# Patient Record
Sex: Male | Born: 1975 | Race: White | Hispanic: No | Marital: Single
Health system: Southern US, Community
[De-identification: ages and names within clinical notes are randomized; demographics above are authoritative.]

## PROBLEM LIST (undated history)

## (undated) DIAGNOSIS — S82892A Other fracture of left lower leg, initial encounter for closed fracture: Secondary | ICD-10-CM

---

## 2018-07-02 ENCOUNTER — Emergency Department: Admit: 2018-07-03 | Payer: PRIVATE HEALTH INSURANCE

## 2018-07-02 DIAGNOSIS — S82842A Displaced bimalleolar fracture of left lower leg, initial encounter for closed fracture: Secondary | ICD-10-CM

## 2018-07-02 NOTE — ED Notes (Signed)
Patient remains awake, alert, oriented x4, talking at 2101.      Second dose propofol administered by provider at 2102.      121/60; 100% on 2L NC; HR89 at 2102.    Manipulation of ankle at 2103.      Splinting in place at 2105.      130/80; HR 86; 100% on 2L    Patient tolerated procedure well, remains oriented x4, responsive to verbal stimuli.

## 2018-07-02 NOTE — ED Notes (Signed)
Patient has been provided crutches with instructions.  Splint in place.  Patient tolerated procedure well.

## 2018-07-02 NOTE — ED Notes (Signed)
End tidal CO2 monitoring, cardiac monitoring, continuous pulse oximetry in place.  Ambu bag at bedside, suction at bedside. Normal saline KVO fluids infusing.

## 2018-07-02 NOTE — ED Notes (Signed)
Consent for moderate sedation and reduction of left ankle signed by wife, Amy. Dr. Bourdon and Janoy, RN present.

## 2018-07-02 NOTE — ED Notes (Signed)
Moderate sedation beginning at 2059.     Dr. Bourdon administering propofol IV.  135/75; HR 91; 100% on 2L NC at 2100
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
## 2018-07-02 NOTE — ED Notes (Signed)
I have reviewed discharge instructions with the patient.  The patient verbalized understanding.    Patient left ED via Discharge Method: wheelchair to Home with wife.    Opportunity for questions and clarification provided.       Patient given 1 scripts.         To continue your aftercare when you leave the hospital, you may receive an automated call from our care team to check in on how you are doing.  This is a free service and part of our promise to provide the best care and service to meet your aftercare needs.??? If you have questions, or wish to unsubscribe from this service please call [phone].  Thank you for Choosing our [facility].

## 2018-07-02 NOTE — ED Notes (Addendum)
Radiology called for repeat xray. Sensation present in left foot; patient reports throbbing.  Pedal pulse present.
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
## 2018-07-02 NOTE — ED Notes (Signed)
Consent for moderate sedation and reduction of left ankle signed by wife, Amy. Dr. Marcene Corning and Isabelle Course, RN present.

## 2018-07-02 NOTE — ED Notes (Signed)
Moderate sedation beginning at 2059.     Dr. Marcene CorningBourdon administering propofol IV.  135/75; HR 91; 100% on 2L NC at 2100

## 2018-07-02 NOTE — ED Notes (Signed)
Radiology called for repeat xray. Sensation present in left foot; patient reports throbbing.  Pedal pulse present.

## 2018-07-02 NOTE — ED Notes (Signed)
Patient remains awake, alert, oriented x4, talking at 2101.      Second dose propofol administered by provider at 2102.      121/60; 100% on 2L NC; HR89 at 2102.    Manipulation of ankle at 2103.      Splinting in place at 2105.      130/80; HR 86; 100% on 2L    Patient tolerated procedure well, remains oriented x4, responsive to verbal stimuli.

## 2018-07-02 NOTE — ED Notes (Signed)
End tidal CO2 monitoring, cardiac monitoring, continuous pulse oximetry in place.  Ambu bag at bedside, suction at bedside. Normal saline KVO fluids infusing.
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
## 2018-07-02 NOTE — ED Notes (Signed)
 I have reviewed discharge instructions with the patient.  The patient verbalized understanding.    Patient left ED via Discharge Method: wheelchair to Home with wife.    Opportunity for questions and clarification provided.       Patient given 1 scripts.         To continue your aftercare when you leave the hospital, you may receive an automated call from our care team to check in on how you are doing.  This is a free service and part of our promise to provide the best care and service to meet your aftercare needs." If you have questions, or wish to unsubscribe from this service please call [phone].  Thank you for Choosing our [facility] Emergency Department.

## 2018-07-02 NOTE — ED Notes (Signed)
Patient has been provided crutches with instructions.  Splint in place.  Patient tolerated procedure well.

## 2018-07-03 ENCOUNTER — Inpatient Hospital Stay
Admit: 2018-07-03 | Discharge: 2018-07-03 | Disposition: A | Discharge status: Home / Self Care | Payer: PRIVATE HEALTH INSURANCE | Attending: Emergency Medicine

## 2018-07-03 MED ORDER — HYDROCODONE-ACETAMINOPHEN 10 MG-325 MG TAB
10-325 mg | ORAL_TABLET | Freq: Four times a day (QID) | ORAL | 0 refills | Status: AC | PRN
Start: 2018-07-03 — End: 2018-07-05

## 2018-07-03 MED ORDER — PROPOFOL 10 MG/ML IV EMUL
10 mg/mL | INTRAVENOUS | Status: AC
Start: 2018-07-03 — End: 2018-07-02
  Administered 2018-07-03: 01:00:00 via INTRAVENOUS

## 2018-07-03 MED ORDER — HYDROMORPHONE (PF) 1 MG/ML IJ SOLN
1 mg/mL | Freq: Once | INTRAMUSCULAR | Status: AC
Start: 2018-07-03 — End: 2018-07-02
  Administered 2018-07-03: 01:00:00 via INTRAVENOUS

## 2018-07-03 MED ORDER — HYDROMORPHONE (PF) 1 MG/ML IJ SOLN
1 mg/mL | INTRAMUSCULAR | Status: AC
Start: 2018-07-03 — End: 2018-07-02
  Administered 2018-07-03: 01:00:00 via INTRAVENOUS

## 2018-07-03 MED ORDER — ONDANSETRON (PF) 4 MG/2 ML INJECTION
4 mg/2 mL | INTRAMUSCULAR | Status: AC
Start: 2018-07-03 — End: 2018-07-02
  Administered 2018-07-03: via INTRAVENOUS

## 2018-07-03 MED ORDER — MORPHINE 10 MG/ML INJ SOLUTION
10 mg/ml | INTRAMUSCULAR | Status: AC
Start: 2018-07-03 — End: 2018-07-02
  Administered 2018-07-03: via INTRAVENOUS

## 2018-07-03 MED FILL — HYDROMORPHONE (PF) 1 MG/ML IJ SOLN: 1 mg/mL | INTRAMUSCULAR | Qty: 1

## 2018-07-03 MED FILL — PROPOFOL 10 MG/ML IV EMUL: 10 mg/mL | INTRAVENOUS | Qty: 20

## 2018-07-03 MED FILL — MORPHINE 10 MG/ML SYRINGE: 10 mg/mL | INTRAMUSCULAR | Qty: 1

## 2018-07-03 MED FILL — ONDANSETRON (PF) 4 MG/2 ML INJECTION: 4 mg/2 mL | INTRAMUSCULAR | Qty: 2

## 2018-07-04 ENCOUNTER — Inpatient Hospital Stay: Payer: PRIVATE HEALTH INSURANCE

## 2018-07-04 ENCOUNTER — Ambulatory Visit: Admit: 2018-07-04 | Payer: PRIVATE HEALTH INSURANCE

## 2018-07-04 MED ORDER — SODIUM CHLORIDE 0.9 % IJ SYRG
INTRAMUSCULAR | Status: DC | PRN
Start: 2018-07-04 — End: 2018-07-04

## 2018-07-04 MED ORDER — SODIUM CHLORIDE 0.9 % IJ SYRG
Freq: Three times a day (TID) | INTRAMUSCULAR | Status: DC
Start: 2018-07-04 — End: 2018-07-04

## 2018-07-04 MED ORDER — LIDOCAINE HCL 1 % (10 MG/ML) IJ SOLN
10 mg/mL (1 %) | INTRAMUSCULAR | Status: DC | PRN
Start: 2018-07-04 — End: 2018-07-04

## 2018-07-04 MED ORDER — ACETAMINOPHEN 500 MG TAB
500 mg | Freq: Four times a day (QID) | ORAL | Status: DC | PRN
Start: 2018-07-04 — End: 2018-07-04

## 2018-07-04 MED ORDER — CEFAZOLIN 2 GRAM/20 ML IN STERILE WATER INTRAVENOUS SYRINGE
2 gram/0 mL | Freq: Once | INTRAVENOUS | Status: AC
Start: 2018-07-04 — End: 2018-07-04
  Administered 2018-07-04: 20:00:00 via INTRAVENOUS

## 2018-07-04 MED ORDER — HYDROCODONE-ACETAMINOPHEN 5 MG-325 MG TAB
5-325 mg | ORAL | Status: DC | PRN
Start: 2018-07-04 — End: 2018-07-04

## 2018-07-04 MED ORDER — MIDAZOLAM 1 MG/ML IJ SOLN
1 mg/mL | Freq: Once | INTRAMUSCULAR | Status: AC | PRN
Start: 2018-07-04 — End: 2018-07-04
  Administered 2018-07-04: 17:00:00 via INTRAVENOUS

## 2018-07-04 MED ORDER — ROPIVACAINE (PF) 5 MG/ML (0.5 %) INJECTION
5 mg/mL (0. %) | INTRAMUSCULAR | Status: AC
Start: 2018-07-04 — End: 2018-07-04
  Administered 2018-07-04: 17:00:00 via PERINEURAL

## 2018-07-04 MED ORDER — SODIUM CHLORIDE 0.9 % INJECTION
25 mg/mL | INTRAMUSCULAR | Status: DC | PRN
Start: 2018-07-04 — End: 2018-07-04

## 2018-07-04 MED ORDER — PROPOFOL 10 MG/ML IV EMUL
10 mg/mL | INTRAVENOUS | Status: DC | PRN
Start: 2018-07-04 — End: 2018-07-04
  Administered 2018-07-04: 20:00:00 via INTRAVENOUS

## 2018-07-04 MED ORDER — LACTATED RINGERS IV
INTRAVENOUS | Status: DC
Start: 2018-07-04 — End: 2018-07-04
  Administered 2018-07-04: 17:00:00 via INTRAVENOUS

## 2018-07-04 MED ORDER — FENTANYL CITRATE (PF) 50 MCG/ML IJ SOLN
50 mcg/mL | Freq: Once | INTRAMUSCULAR | Status: AC
Start: 2018-07-04 — End: 2018-07-04
  Administered 2018-07-04: 17:00:00 via INTRAVENOUS

## 2018-07-04 MED ORDER — MIDAZOLAM 1 MG/ML IJ SOLN
1 mg/mL | INTRAMUSCULAR | Status: AC
Start: 2018-07-04 — End: ?

## 2018-07-04 MED ORDER — SODIUM CHLORIDE 0.9 % IV
INTRAVENOUS | Status: DC
Start: 2018-07-04 — End: 2018-07-04

## 2018-07-04 MED ORDER — FAMOTIDINE 20 MG TAB
20 mg | Freq: Once | ORAL | Status: AC
Start: 2018-07-04 — End: 2018-07-04
  Administered 2018-07-04: 17:00:00 via ORAL

## 2018-07-04 MED ORDER — ROPIVACAINE (PF) 5 MG/ML (0.5 %) INJECTION
50.5 mg/mL (0. %) | INTRAMUSCULAR | Status: AC
Start: 2018-07-04 — End: 2018-07-04
  Administered 2018-07-04: 17:00:00 via PERINEURAL

## 2018-07-04 MED ORDER — HYDROMORPHONE (PF) 2 MG/ML IJ SOLN
2 mg/mL | INTRAMUSCULAR | Status: DC | PRN
Start: 2018-07-04 — End: 2018-07-04

## 2018-07-04 MED ORDER — LACTATED RINGERS IV
INTRAVENOUS | Status: DC
Start: 2018-07-04 — End: 2018-07-04

## 2018-07-04 MED ORDER — LIDOCAINE (PF) 20 MG/ML (2 %) IJ SOLN
20 mg/mL (2 %) | INTRAMUSCULAR | Status: DC | PRN
Start: 2018-07-04 — End: 2018-07-04
  Administered 2018-07-04: 20:00:00 via INTRAVENOUS

## 2018-07-04 MED FILL — FENTANYL CITRATE (PF) 50 MCG/ML IJ SOLN: 50 mcg/mL | INTRAMUSCULAR | Qty: 2

## 2018-07-04 MED FILL — NAROPIN (PF) 5 MG/ML (0.5 %) INJECTION SOLUTION: 5 mg/mL (0. %) | INTRAMUSCULAR | Qty: 22.5

## 2018-07-04 MED FILL — MIDAZOLAM 1 MG/ML IJ SOLN: 1 mg/mL | INTRAMUSCULAR | Qty: 2

## 2018-07-04 MED FILL — XYLOCAINE-MPF 20 MG/ML (2 %) INJECTION SOLUTION: 20 mg/mL (2 %) | INTRAMUSCULAR | Qty: 60

## 2018-07-04 MED FILL — EPINEPHRINE (PF) 1 MG/ML INJECTION: 1 mg/mL ( mL) | INTRAMUSCULAR | Qty: 0.1

## 2018-07-04 MED FILL — CEFAZOLIN 2 GRAM/20 ML IN STERILE WATER INTRAVENOUS SYRINGE: 2 gram/0 mL | INTRAVENOUS | Qty: 20

## 2018-07-04 MED FILL — FAMOTIDINE 20 MG TAB: 20 mg | ORAL | Qty: 1

## 2018-07-04 MED FILL — PROPOFOL 10 MG/ML IV EMUL: 10 mg/mL | INTRAVENOUS | Qty: 412.06
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
## 2018-07-04 NOTE — Anesthesia Post-Procedure Evaluation (Signed)
Procedure(s):  ANKLE OPEN REDUCTION INTERNAL FIXATION.    total IV anesthesia, general - backup    Anesthesia Post Evaluation      Multimodal analgesia: multimodal analgesia used between 6 hours prior to anesthesia start to PACU discharge  Patient location during evaluation: bedside  Patient participation: complete - patient participated  Level of consciousness: awake and alert  Pain score: 0  Pain management: adequate  Airway patency: patent  Anesthetic complications: no  Cardiovascular status: acceptable  Respiratory status: acceptable  Hydration status: acceptable  Comments: Pt doing well. Ok to d/c home.   Post anesthesia nausea and vomiting:  none      Vitals Value Taken Time   BP 105/76 07/04/2018  4:41 PM   Temp 36.7 ??C (98 ??F) 07/04/2018  4:25 PM   Pulse 68 07/04/2018  4:43 PM   Resp 14 07/04/2018  4:41 PM   SpO2 99 % 07/04/2018  4:43 PM   Vitals shown include unvalidated device data.

## 2018-07-04 NOTE — Anesthesia Procedure Notes (Signed)
Peripheral Block    Start time: 07/04/2018 1:17 PM  End time: 07/04/2018 1:21 PM  Performed by: Yesenia Locurto E, MD  Authorized by: Samuel Rittenhouse E, MD       Pre-procedure:   Indications: at surgeon's request and post-op pain management    Preanesthetic Checklist: patient identified, risks and benefits discussed, site marked, timeout performed, anesthesia consent given and patient being monitored    Timeout Time: 13:17          Block Type:   Block Type:  Adductor canal  Laterality:  Left  Monitoring:  Standard ASA monitoring, responsive to questions, oxygen, continuous pulse ox, frequent vital sign checks and heart rate  Injection Technique:  Single shot  Procedures: ultrasound guided    Patient Position: supine  Prep: chlorhexidine    Location:  Mid thigh  Needle Type:  Stimuplex  Needle Gauge:  22 G  Needle Localization:  Ultrasound guidance    Assessment:  Number of attempts:  1  Injection Assessment:  Incremental injection every 5 mL, local visualized surrounding nerve on ultrasound, negative aspiration for blood, no intravascular symptoms, no paresthesia and ultrasound image on chart  Patient tolerance:  Patient tolerated the procedure well with no immediate complications  All needles out intact, proc. Tolerated well.

## 2018-07-04 NOTE — Other (Signed)
PACU DISCHARGE NOTE  Vital signs stable, pain well controlled, alert and oriented times three or at baseline, no anesthetic complications. IV removed with catheter tip intact. Patient has crutches and has experience with their use. Written and verbal discharge instructions given, including pain control, dressing care and follow up appointment.   Spouse, Amy, verbalized understanding and signed discharge instructions electronically. All questions answered prior to discharge. Dr Simril okay to discharge at this time.  Pt and all belongings taken via wheelchair and safely put in vehicle.

## 2018-07-04 NOTE — Anesthesia Procedure Notes (Signed)
Peripheral Block    Start time: 07/04/2018 1:11 PM  End time: 07/04/2018 1:16 PM  Performed by: Robina Hamor E, MD  Authorized by: Hollyann Pablo E, MD       Pre-procedure:   Indications: at surgeon's request and post-op pain management    Preanesthetic Checklist: patient identified, risks and benefits discussed, site marked, timeout performed, anesthesia consent given and patient being monitored    Timeout Time: 13:11          Block Type:   Block Type:  Popliteal  Laterality:  Left  Monitoring:  Standard ASA monitoring, responsive to questions, oxygen, continuous pulse ox, frequent vital sign checks and heart rate  Injection Technique:  Single shot  Procedures: ultrasound guided and nerve stimulator    Patient Position: right lateral decubitus (lateral decubitus)  Prep: chlorhexidine    Location:  Mid thigh  Needle Type:  Stimuplex  Needle Gauge:  22 G  Needle Localization:  Nerve stimulator and ultrasound guidance  Motor Response: minimal motor response >0.4 mA      Assessment:  Number of attempts:  1  Injection Assessment:  Incremental injection every 5 mL, local visualized surrounding nerve on ultrasound, negative aspiration for blood, no intravascular symptoms, no paresthesia and ultrasound image on chart  Patient tolerance:  Patient tolerated the procedure well with no immediate complications  All needles out intact, proc. Tolerated well.

## 2018-07-04 NOTE — Anesthesia Pre-Procedure Evaluation (Signed)
Relevant Problems   No relevant active problems       Anesthetic History   No history of anesthetic complications            Review of Systems / Medical History  Patient summary reviewed and pertinent labs reviewed    Pulmonary          Smoker (quit 8 months)         Neuro/Psych   Within defined limits           Cardiovascular                       GI/Hepatic/Renal  Within defined limits              Endo/Other  Within defined limits           Other Findings              Physical Exam    Airway  Mallampati: II  TM Distance: 4 - 6 cm  Neck ROM: normal range of motion   Mouth opening: Diminished (comment)     Cardiovascular  Regular rate and rhythm,  S1 and S2 normal,  no murmur, click, rub, or gallop  Rhythm: regular           Dental  No notable dental hx       Pulmonary  Breath sounds clear to auscultation               Abdominal         Other Findings            Anesthetic Plan    ASA: 2  Anesthesia type: total IV anesthesia and general - backup          Induction: Intravenous  Anesthetic plan and risks discussed with: Patient and Spouse

## 2018-07-04 NOTE — Brief Op Note (Signed)
BRIEF OPERATIVE NOTE    Date of Procedure: 07/04/2018   Preoperative Diagnosis: Closed fracture of left ankle, initial encounter [S82.892A]  Postoperative Diagnosis: Closed fracture of left ankle, initial encounter [S82.892A]    Procedure(s):  ANKLE OPEN REDUCTION INTERNAL FIXATION  Surgeon(s) and Role:     * Womack, Quin Hoop, MD - Primary         Surgical Assistant: no    Surgical Staff:  Circ-1: Venia Minks, RN  Scrub Tech-1: Thisius, Rachel  Event Time In Time Out   Incision Start 1544    Incision Close 1617      Anesthesia: General   Estimated Blood Loss: min  Specimens: * No specimens in log *   Findings: no  Complications: no  Implants:   Implant Name Type Inv. Item Serial No. Manufacturer Lot No. LRB No. Used Action   KIT ANK SYNDESMOSIS REPAIR -- INVISIKNOT - [id number]  KIT ANK SYNDESMOSIS REPAIR -- [employer] [id number] Left 1 Implanted   PLATE LAT DSTL FB 5.7/3.2 5H L --  - [id number]  PLATE LAT DSTL FB 3.7/6.2 5H L --   [employer] [place] [id number] Left 1 Implanted   SCR LCK ST T8 2.7X12MM TI -- STARDRIVE - [id number]  SCR LCK ST T8 2.7X12MM TI -- STARDRIVE  [employer] [place] [id number] Left 2 Implanted   SCR LCK ST T8 2.7X14MM TI -- STARDRIVE - [id number]  SCR LCK ST T8 2.7X14MM TI -- STARDRIVE  [employer] [place] [id number] Left 1 Implanted   SCR LCK ST T8 2.7X16MM TI -- STARDRIVE - [id number]  SCR LCK ST T8 2.7X16MM TI -- STARDRIVE  [employer] [place] [id number] Left 2 Implanted   SCR BNE CRTX ST 3.5X16MM TI --  - [id number]  SCR BNE CRTX ST 3.5X16MM TI --   [employer] [place] [id number] Left 3 Implanted

## 2018-07-04 NOTE — Anesthesia Pre-Procedure Evaluation (Signed)
Relevant Problems   No relevant active problems       Anesthetic History   No history of anesthetic complications            Review of Systems / Medical History  Patient summary reviewed and pertinent labs reviewed    Pulmonary          Smoker (quit 8 months)         Neuro/Psych   Within defined limits           Cardiovascular                       GI/Hepatic/Renal  Within defined limits              Endo/Other  Within defined limits           Other Findings              Physical Exam    Airway  Mallampati: II  TM Distance: 4 - 6 cm  Neck ROM: normal range of motion   Mouth opening: Diminished (comment)     Cardiovascular  Regular rate and rhythm,  S1 and S2 normal,  no murmur, click, rub, or gallop  Rhythm: regular           Dental  No notable dental hx       Pulmonary  Breath sounds clear to auscultation               Abdominal         Other Findings            Anesthetic Plan    ASA: 2  Anesthesia type: total IV anesthesia and general - backup          Induction: Intravenous  Anesthetic plan and risks discussed with: Patient and Spouse
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
## 2018-07-04 NOTE — Anesthesia Post-Procedure Evaluation (Signed)
Procedure(s):  ANKLE OPEN REDUCTION INTERNAL FIXATION.    total IV anesthesia, general - backup    Anesthesia Post Evaluation      Multimodal analgesia: multimodal analgesia used between 6 hours prior to anesthesia start to PACU discharge  Patient location during evaluation: bedside  Patient participation: complete - patient participated  Level of consciousness: awake and alert  Pain score: 0  Pain management: adequate  Airway patency: patent  Anesthetic complications: no  Cardiovascular status: acceptable  Respiratory status: acceptable  Hydration status: acceptable  Comments: Pt doing well. Ok to d/c home.   Post anesthesia nausea and vomiting:  none      Vitals Value Taken Time   BP 105/76 07/04/2018  4:41 PM   Temp 36.7 ??C (98 ??F) 07/04/2018  4:25 PM   Pulse 68 07/04/2018  4:43 PM   Resp 14 07/04/2018  4:41 PM   SpO2 99 % 07/04/2018  4:43 PM   Vitals shown include unvalidated device data.

## 2018-07-04 NOTE — Anesthesia Procedure Notes (Signed)
Peripheral Block    Start time: 07/04/2018 1:17 PM  End time: 07/04/2018 1:21 PM  Performed by: Arlis PortaNovia, Tamecka Milham E, MD  Authorized by: Arlis PortaNovia, Pinkie Manger E, MD       Pre-procedure:   Indications: at surgeon's request and post-op pain management    Preanesthetic Checklist: patient identified, risks and benefits discussed, site marked, timeout performed, anesthesia consent given and patient being monitored    Timeout Time: 13:17          Block Type:   Block Type:  Adductor canal  Laterality:  Left  Monitoring:  Standard ASA monitoring, responsive to questions, oxygen, continuous pulse ox, frequent vital sign checks and heart rate  Injection Technique:  Single shot  Procedures: ultrasound guided    Patient Position: supine  Prep: chlorhexidine    Location:  Mid thigh  Needle Type:  Stimuplex  Needle Gauge:  22 G  Needle Localization:  Ultrasound guidance    Assessment:  Number of attempts:  1  Injection Assessment:  Incremental injection every 5 mL, local visualized surrounding nerve on ultrasound, negative aspiration for blood, no intravascular symptoms, no paresthesia and ultrasound image on chart  Patient tolerance:  Patient tolerated the procedure well with no immediate complications  All needles out intact, proc. Tolerated well.

## 2018-07-04 NOTE — Interval H&P Note (Signed)
PACU DISCHARGE NOTE  Vital signs stable, pain well controlled, alert and oriented times three or at baseline, no anesthetic complications. IV removed with catheter tip intact. Patient has crutches and has experience with their use. Written and verbal discharge instructions given, including pain control, dressing care and follow up appointment.   Spouse, Amy, verbalized understanding and signed discharge instructions electronically. All questions answered prior to discharge. Dr Margarette AsalSimril okay to discharge at this time.  Pt and all belongings taken via wheelchair and safely put in vehicle.

## 2018-07-04 NOTE — Op Note (Signed)
BRIEF OPERATIVE NOTE    Date of Procedure: 07/04/2018   Preoperative Diagnosis: Closed fracture of left ankle, initial encounter [S82.892A]  Postoperative Diagnosis: Closed fracture of left ankle, initial encounter [S82.892A]    Procedure(s):  ANKLE OPEN REDUCTION INTERNAL FIXATION  Surgeon(s) and Role:     * Womack, Gracen Southwell W, MD - Primary         Surgical Assistant: no    Surgical Staff:  Circ-1: Fleming, Jesse P, RN  Scrub Tech-1: Thisius, Rachel  Event Time In Time Out   Incision Start 1544    Incision Close 1617      Anesthesia: General   Estimated Blood Loss: min  Specimens: * No specimens in log *   Findings: no  Complications: no  Implants:   Implant Name Type Inv. Item Serial No. Manufacturer Lot No. LRB No. Used Action   KIT ANK SYNDESMOSIS REPAIR -- INVISIKNOT - [id number]  KIT ANK SYNDESMOSIS REPAIR -- INVISIKNOT  [employer] [id number] Left 1 Implanted   PLATE LAT DSTL FB 2.7/3.5 5H L --  - [id number]  PLATE LAT DSTL FB 2.7/3.5 5H L --   [employer] [place] [id number] Left 1 Implanted   SCR LCK ST T8 2.7X12MM TI -- STARDRIVE - [id number]  SCR LCK ST T8 2.7X12MM TI -- STARDRIVE  [employer] [place] [id number] Left 2 Implanted   SCR LCK ST T8 2.7X14MM TI -- STARDRIVE - [id number]  SCR LCK ST T8 2.7X14MM TI -- STARDRIVE  [employer] [place] [id number] Left 1 Implanted   SCR LCK ST T8 2.7X16MM TI -- STARDRIVE - [id number]  SCR LCK ST T8 2.7X16MM TI -- STARDRIVE  [employer] [place] [id number] Left 2 Implanted   SCR BNE CRTX ST 3.5X16MM TI --  - [id number]  SCR BNE CRTX ST 3.5X16MM TI --   [employer] [place] [id number] Left 3 Implanted

## 2018-07-04 NOTE — Anesthesia Procedure Notes (Signed)
Peripheral Block    Start time: 07/04/2018 1:11 PM  End time: 07/04/2018 1:16 PM  Performed by: Arlis PortaNovia, Sahib Pella E, MD  Authorized by: Arlis PortaNovia, Zamariya Neal E, MD       Pre-procedure:   Indications: at surgeon's request and post-op pain management    Preanesthetic Checklist: patient identified, risks and benefits discussed, site marked, timeout performed, anesthesia consent given and patient being monitored    Timeout Time: 13:11          Block Type:   Block Type:  Popliteal  Laterality:  Left  Monitoring:  Standard ASA monitoring, responsive to questions, oxygen, continuous pulse ox, frequent vital sign checks and heart rate  Injection Technique:  Single shot  Procedures: ultrasound guided and nerve stimulator    Patient Position: right lateral decubitus (lateral decubitus)  Prep: chlorhexidine    Location:  Mid thigh  Needle Type:  Stimuplex  Needle Gauge:  22 G  Needle Localization:  Nerve stimulator and ultrasound guidance  Motor Response: minimal motor response >0.4 mA      Assessment:  Number of attempts:  1  Injection Assessment:  Incremental injection every 5 mL, local visualized surrounding nerve on ultrasound, negative aspiration for blood, no intravascular symptoms, no paresthesia and ultrasound image on chart  Patient tolerance:  Patient tolerated the procedure well with no immediate complications  All needles out intact, proc. Tolerated well.

## 2019-04-06 ENCOUNTER — Emergency Department: Admit: 2019-04-06 | Payer: PRIVATE HEALTH INSURANCE

## 2019-04-06 ENCOUNTER — Inpatient Hospital Stay
Admit: 2019-04-06 | Discharge: 2019-04-09 | Disposition: A | Discharge status: Home / Self Care | Payer: PRIVATE HEALTH INSURANCE | Attending: Family Medicine | Admitting: Family Medicine

## 2019-04-06 DIAGNOSIS — A419 Sepsis, unspecified organism: Secondary | ICD-10-CM

## 2019-04-06 LAB — CBC WITH AUTOMATED DIFF
ABS. BASOPHILS: 0 10*3/uL (ref 0.0–0.2)
ABS. EOSINOPHILS: 0.1 10*3/uL (ref 0.0–0.8)
ABS. IMM. GRANS.: 0.1 10*3/uL (ref 0.0–0.5)
ABS. LYMPHOCYTES: 1.9 10*3/uL (ref 0.5–4.6)
ABS. MONOCYTES: 1.1 10*3/uL (ref 0.1–1.3)
ABS. NEUTROPHILS: 16.7 10*3/uL — ABNORMAL HIGH (ref 1.7–8.2)
ABSOLUTE NRBC: 0 10*3/uL (ref 0.0–0.2)
BASOPHILS: 0 % (ref 0.0–2.0)
EOSINOPHILS: 0 % — ABNORMAL LOW (ref 0.5–7.8)
HCT: 35.8 % — ABNORMAL LOW (ref 41.1–50.3)
HGB: 12.8 g/dL — ABNORMAL LOW (ref 13.6–17.2)
IMMATURE GRANULOCYTES: 0 % (ref 0.0–5.0)
LYMPHOCYTES: 10 % — ABNORMAL LOW (ref 13–44)
MCH: 30.3 PG (ref 26.1–32.9)
MCHC: 35.8 g/dL — ABNORMAL HIGH (ref 31.4–35.0)
MCV: 84.6 FL (ref 79.6–97.8)
MONOCYTES: 6 % (ref 4.0–12.0)
MPV: 9.2 FL — ABNORMAL LOW (ref 9.4–12.3)
NEUTROPHILS: 84 % — ABNORMAL HIGH (ref 43–78)
PLATELET: 333 10*3/uL (ref 150–450)
RBC: 4.23 M/uL (ref 4.23–5.6)
RDW: 12.5 % (ref 11.9–14.6)
WBC: 19.9 10*3/uL — ABNORMAL HIGH (ref 4.3–11.1)

## 2019-04-06 LAB — METABOLIC PANEL, COMPREHENSIVE
A-G Ratio: 0.7 — ABNORMAL LOW (ref 1.2–3.5)
ALT (SGPT): 22 U/L (ref 12–65)
AST (SGOT): 14 U/L — ABNORMAL LOW (ref 15–37)
Albumin: 3.2 g/dL — ABNORMAL LOW (ref 3.5–5.0)
Alk. phosphatase: 114 U/L (ref 50–136)
Anion gap: 7 mmol/L (ref 7–16)
BUN: 14 MG/DL (ref 6–23)
Bilirubin, total: 0.4 MG/DL (ref 0.2–1.1)
CO2: 27 mmol/L (ref 21–32)
Calcium: 9.1 MG/DL (ref 8.3–10.4)
Chloride: 105 mmol/L (ref 98–107)
Creatinine: 0.84 MG/DL (ref 0.8–1.5)
GFR est AA: >60 mL/min/{1.73_m2} (ref >60)
GFR est non-AA: >60 mL/min/{1.73_m2} (ref >60)
Globulin: 4.3 g/dL — ABNORMAL HIGH (ref 2.3–3.5)
Glucose: 118 mg/dL — ABNORMAL HIGH (ref 65–100)
Potassium: 4 mmol/L (ref 3.5–5.1)
Protein, total: 7.5 g/dL (ref 6.3–8.2)
Sodium: 139 mmol/L (ref 136–145)

## 2019-04-06 LAB — LACTIC ACID
Lactic Acid: 1.4 MMOL/L (ref 0.4–2.0)
Lactic acid: 1.4 MMOL/L (ref 0.4–2.0)

## 2019-04-06 LAB — COMPREHENSIVE METABOLIC PANEL
ALT: 22 U/L (ref 12–65)
AST: 14 U/L — ABNORMAL LOW (ref 15–37)
Albumin/Globulin Ratio: 0.7 — ABNORMAL LOW (ref 1.2–3.5)
Albumin: 3.2 g/dL — ABNORMAL LOW (ref 3.5–5.0)
Alkaline Phosphatase: 114 U/L (ref 50–136)
Anion Gap: 7 mmol/L (ref 7–16)
BUN: 14 MG/DL (ref 6–23)
CO2: 27 mmol/L (ref 21–32)
Calcium: 9.1 MG/DL (ref 8.3–10.4)
Chloride: 105 mmol/L (ref 98–107)
Creatinine: 0.84 MG/DL (ref 0.8–1.5)
EGFR IF NonAfrican American: >60 mL/min/{1.73_m2} (ref >60)
GFR African American: >60 mL/min/{1.73_m2} (ref >60)
Globulin: 4.3 g/dL — ABNORMAL HIGH (ref 2.3–3.5)
Glucose: 118 mg/dL — ABNORMAL HIGH (ref 65–100)
Potassium: 4 mmol/L (ref 3.5–5.1)
Sodium: 139 mmol/L (ref 136–145)
Total Bilirubin: 0.4 MG/DL (ref 0.2–1.1)
Total Protein: 7.5 g/dL (ref 6.3–8.2)

## 2019-04-06 LAB — CBC WITH AUTO DIFFERENTIAL
Basophils %: 0 % (ref 0.0–2.0)
Basophils Absolute: 0 10*3/uL (ref 0.0–0.2)
Eosinophils %: 0 % — ABNORMAL LOW (ref 0.5–7.8)
Eosinophils Absolute: 0.1 10*3/uL (ref 0.0–0.8)
Granulocyte Absolute Count: 0.1 10*3/uL (ref 0.0–0.5)
Hematocrit: 35.8 % — ABNORMAL LOW (ref 41.1–50.3)
Hemoglobin: 12.8 g/dL — ABNORMAL LOW (ref 13.6–17.2)
Immature Granulocytes: 0 % (ref 0.0–5.0)
Lymphocytes %: 10 % — ABNORMAL LOW (ref 13–44)
Lymphocytes Absolute: 1.9 10*3/uL (ref 0.5–4.6)
MCH: 30.3 PG (ref 26.1–32.9)
MCHC: 35.8 g/dL — ABNORMAL HIGH (ref 31.4–35.0)
MCV: 84.6 FL (ref 79.6–97.8)
MPV: 9.2 FL — ABNORMAL LOW (ref 9.4–12.3)
Monocytes %: 6 % (ref 4.0–12.0)
Monocytes Absolute: 1.1 10*3/uL (ref 0.1–1.3)
NRBC Absolute: 0 10*3/uL (ref 0.0–0.2)
Neutrophils %: 84 % — ABNORMAL HIGH (ref 43–78)
Neutrophils Absolute: 16.7 10*3/uL — ABNORMAL HIGH (ref 1.7–8.2)
Platelets: 333 10*3/uL (ref 150–450)
RBC: 4.23 M/uL (ref 4.23–5.6)
RDW: 12.5 % (ref 11.9–14.6)
WBC: 19.9 10*3/uL — ABNORMAL HIGH (ref 4.3–11.1)

## 2019-04-06 MED ORDER — HYDROMORPHONE (PF) 1 MG/ML IJ SOLN
1 mg/mL | INTRAMUSCULAR | Status: DC | PRN
Start: 2019-04-06 — End: 2019-04-09
  Administered 2019-04-07 – 2019-04-09 (×9): via INTRAVENOUS

## 2019-04-06 MED ORDER — TRAZODONE 50 MG TAB
50 mg | Freq: Every evening | ORAL | Status: DC
Start: 2019-04-06 — End: 2019-04-09
  Administered 2019-04-08 – 2019-04-09 (×2): via ORAL

## 2019-04-06 MED ORDER — ALPRAZOLAM 0.5 MG TAB
0.5 mg | Freq: Three times a day (TID) | ORAL | Status: DC | PRN
Start: 2019-04-06 — End: 2019-04-09
  Administered 2019-04-07 – 2019-04-09 (×9): via ORAL

## 2019-04-06 MED ORDER — KETOROLAC TROMETHAMINE 30 MG/ML INJECTION
30 mg/mL (1 mL) | INTRAMUSCULAR | Status: AC
Start: 2019-04-06 — End: 2019-04-06
  Administered 2019-04-06: 21:00:00 via INTRAVENOUS

## 2019-04-06 MED ORDER — VANCOMYCIN IN 0.9 % SODIUM CHLORIDE 1.25 GRAM/250 ML IV
1.25 gram/250 mL | Freq: Two times a day (BID) | INTRAVENOUS | Status: DC
Start: 2019-04-06 — End: 2019-04-08
  Administered 2019-04-07 – 2019-04-08 (×3): via INTRAVENOUS

## 2019-04-06 MED ORDER — SODIUM CHLORIDE 0.9 % IV PIGGY BACK
4.5 gram | Freq: Once | INTRAVENOUS | Status: AC
Start: 2019-04-06 — End: 2019-04-07
  Administered 2019-04-06: 21:00:00 via INTRAVENOUS

## 2019-04-06 MED ORDER — DIPHENHYDRAMINE 25 MG CAP
25 mg | ORAL | Status: DC | PRN
Start: 2019-04-06 — End: 2019-04-09
  Administered 2019-04-09: 02:00:00 via ORAL

## 2019-04-06 MED ORDER — SODIUM CHLORIDE 0.9 % IJ SYRG
Freq: Three times a day (TID) | INTRAMUSCULAR | Status: DC
Start: 2019-04-06 — End: 2019-04-07
  Administered 2019-04-07 (×2): via INTRAVENOUS

## 2019-04-06 MED ORDER — HYDROCODONE-ACETAMINOPHEN 5 MG-325 MG TAB
5-325 mg | ORAL | Status: DC | PRN
Start: 2019-04-06 — End: 2019-04-09
  Administered 2019-04-07 – 2019-04-09 (×11): via ORAL

## 2019-04-06 MED ORDER — PANTOPRAZOLE 40 MG TAB, DELAYED RELEASE
40 mg | ORAL | Status: DC
Start: 2019-04-06 — End: 2019-04-09
  Administered 2019-04-07 – 2019-04-08 (×3): via ORAL

## 2019-04-06 MED ORDER — ACETAMINOPHEN 325 MG TABLET
325 mg | ORAL | Status: DC | PRN
Start: 2019-04-06 — End: 2019-04-09

## 2019-04-06 MED ORDER — MELATONIN 3 MG TAB
3 mg | Freq: Every evening | ORAL | Status: DC
Start: 2019-04-06 — End: 2019-04-09

## 2019-04-06 MED ORDER — PIPERACILLIN-TAZOBACTAM 3.375 GRAM IV SOLR
3.375 gram | Freq: Three times a day (TID) | INTRAVENOUS | Status: DC
Start: 2019-04-06 — End: 2019-04-09
  Administered 2019-04-07 – 2019-04-09 (×8): via INTRAVENOUS

## 2019-04-06 MED ORDER — SODIUM CHLORIDE 0.9 % IJ SYRG
INTRAMUSCULAR | Status: DC | PRN
Start: 2019-04-06 — End: 2019-04-09

## 2019-04-06 MED ORDER — VANCOMYCIN IN 0.9 % SODIUM CHLORIDE 1.25 GRAM/250 ML IV
1.25 gram/250 mL | Freq: Once | INTRAVENOUS | Status: AC
Start: 2019-04-06 — End: 2019-04-07
  Administered 2019-04-06: 22:00:00 via INTRAVENOUS

## 2019-04-06 MED ORDER — VANCOMYCIN 1,000 MG IV SOLR
1000 mg | INTRAVENOUS | Status: DC
Start: 2019-04-06 — End: 2019-04-06

## 2019-04-06 MED ORDER — SODIUM CHLORIDE 0.9 % IV
Freq: Once | INTRAVENOUS | Status: AC
Start: 2019-04-06 — End: 2019-04-06
  Administered 2019-04-06: 21:00:00 via INTRAVENOUS

## 2019-04-06 MED FILL — VANCOMYCIN 10 GRAM IV SOLR: 10 gram | INTRAVENOUS | Qty: 1250

## 2019-04-06 MED FILL — KETOROLAC TROMETHAMINE 30 MG/ML INJECTION: 30 mg/mL (1 mL) | INTRAMUSCULAR | Qty: 1

## 2019-04-06 MED FILL — ADV ADDAPTOR: Qty: 1

## 2019-04-06 NOTE — Consults (Signed)
FRACTURE CONSULT NOTE    Subjective:     Date of Consultation:  April 06, 2019  Referring Physician:  Dial    Jeremy Jackson is a 43 y.o. male who is being seen for right hand and finger pain. Patient noted 5 day history of pain in middle finger. Denies specific injury. Also noted areas on right leg with pustules  Evaluated at [facility] yesterday and refused treatment and admission  There are no active problems to display for this patient.    History reviewed. No pertinent family history.   Social History     Tobacco Use   ??? Smoking status: Not on file   Substance Use Topics   ??? Alcohol use: Not on file     History reviewed. No pertinent past medical history.   History reviewed. No pertinent surgical history.   Prior to Admission medications    Medication Sig Start Date End Date Taking? Authorizing Provider   traZODone (DESYREL) 100 mg tablet Take 100 mg by mouth nightly.    Provider, Historical   melatonin 3 mg tablet Take 3 mg by mouth.    Provider, Historical   diphenhydrAMINE (BENADRYL ALLERGY) 25 mg tablet Take 25 mg by mouth every six (6) hours as needed for Sleep.    Provider, Historical   ibuprofen (MOTRIN) 200 mg tablet Take  by mouth.    Provider, Historical     Current Facility-Administered Medications   Medication Dose Route Frequency   ??? vancomycin (VANCOCIN) 1250 mg in NS 250 ml infusion  1,250 mg IntraVENous ONCE   ??? piperacillin-tazobactam (ZOSYN) 4.5 g in 0.9% sodium chloride (MBP/ADV) 100 mL  4.5 g IntraVENous ONCE     Current Outpatient Medications   Medication Sig   ??? traZODone (DESYREL) 100 mg tablet Take 100 mg by mouth nightly.   ??? melatonin 3 mg tablet Take 3 mg by mouth.   ??? diphenhydrAMINE (BENADRYL ALLERGY) 25 mg tablet Take 25 mg by mouth every six (6) hours as needed for Sleep.   ??? ibuprofen (MOTRIN) 200 mg tablet Take  by mouth.      Allergies   Allergen Reactions   ??? Pcn [Penicillins] Swelling        Review of Systems:  Pertinent items are noted in HPI.    Mental Status: no dementia     Objective:     Patient Vitals for the past 8 hrs:   BP Temp Pulse Resp SpO2 Height Weight   04/06/19 1613 128/77 98.4 ??F (36.9 ??C) (!) 108 20 97 % 5\' 9"  (1.753 m) 74.8 kg (165 lb)     Temp (24hrs), Avg:98.4 ??F (36.9 ??C), Min:98.4 ??F (36.9 ??C), Max:98.4 ??F (36.9 ??C)      EXAM:   General:  Alert, cooperative, well noursished, well developed, appears stated age   Eyes:  Sclera anicteric. PERRL   Mouth/Throat: Mucous membranes normal, oral pharynx clear   Neck: Supple   Lungs:   Clear to auscultation bilaterally, good effort   CV:  Regular rate and rhythm,no murmur, click, rub or gallop   Abdomen:   Soft, non-tender. bowel sounds normal. non-distended   Extremities: Right middle finger with extensor surface significantly swollenand reddened.  Motion limited. Fusiform swelling. No palmar tenderness  Extensor surface swollen at MP, PIP   Skin: Skin color, texture, turgor normal. no acute rash or lesions   Lymph nodes: Cervical and supraclavicular normal   Musculoskeletal:  Right lower ext with multiple blebs with eschar over leg  and thigh   Lines/Devices:  Intact, no erythema, drainage or tenderness   Psych: Alert and oriented, normal mood affect given the setting         Imaging Review:     Labs: No results found for this or any previous visit (from the past 24 hour(s)).      Impression:     Active Problems:    * No active hospital problems. *    Extensor tenosynovitis right middle finger  Plan:   I and D. Patient recently has eaten and will undergo procedure tomorrow  Begin IV antibiotics per hospitalist  Procedure explained in detail      Camille BalMark W Deasiah Hagberg, DO
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
## 2019-04-06 NOTE — Progress Notes (Signed)
Patient refused his trazodone and melatonin stating that it does not work for him.

## 2019-04-06 NOTE — ED Notes (Signed)
TRANSFER - OUT REPORT:    Verbal report given to Anna, RN(name) on Jeremy Jackson  being transferred to 727(unit) for routine progression of care       Report consisted of patient???s Situation, Background, Assessment and   Recommendations(SBAR).     Information from the following report(s) SBAR, ED Summary and MAR was reviewed with the receiving nurse.    Lines:   Peripheral IV 04/06/19 Right Antecubital (Active)   Site Assessment Clean, dry, & intact 04/06/2019  4:34 PM       Peripheral IV 04/06/19 Left Forearm (Active)   Site Assessment Clean, dry, & intact 04/06/2019  5:10 PM        Opportunity for questions and clarification was provided.
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
## 2019-04-06 NOTE — Progress Notes (Signed)
Pharmacokinetic Consult to Pharmacist    Jeremy Jackson is a 42 y.o. male being treated for SSTI with vancomycin.    Height: 5' 9" (175.3 cm)  Weight: 74.8 kg (165 lb)  Lab Results   Component Value Date/Time    BUN 14 04/06/2019 04:20 PM    Creatinine 0.84 04/06/2019 04:20 PM    WBC 19.9 (H) 04/06/2019 04:20 PM    Lactic acid 1.4 04/06/2019 04:20 PM      Estimated Creatinine Clearance: 114.6 mL/min (based on SCr of 0.84 mg/dL).      No results found for: VANCT, VANCR    Day 1 of vancomycin.  Goal trough is 10-20.  Will initiate 1250 mg q 12 hours.  Will continue to follow patient.      Thank you,  Taylor Kaye Servais, PharmD  Clinical Pharmacy PGY1 Resident   [phone]

## 2019-04-06 NOTE — ED Notes (Signed)
 TRANSFER - OUT REPORT:    Verbal report given to Therisa, RN(name) on Jeremy Jackson  being transferred to 727(unit) for routine progression of care       Report consisted of patient's Situation, Background, Assessment and   Recommendations(SBAR).     Information from the following report(s) SBAR, ED Summary and MAR was reviewed with the receiving nurse.    Lines:   Peripheral IV 04/06/19 Right Antecubital (Active)   Site Assessment Clean, dry, & intact 04/06/2019  4:34 PM       Peripheral IV 04/06/19 Left Forearm (Active)   Site Assessment Clean, dry, & intact 04/06/2019  5:10 PM        Opportunity for questions and clarification was provided.
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
## 2019-04-06 NOTE — Progress Notes (Signed)
Pharmacokinetic Consult to Pharmacist    Antonie Fausnaugh is a 43 y.o. male being treated for SSTI with vancomycin.    Height: 5\' 9"  (175.3 cm)  Weight: 74.8 kg (165 lb)  Lab Results   Component Value Date/Time    BUN 14 04/06/2019 04:20 PM    Creatinine 0.84 04/06/2019 04:20 PM    WBC 19.9 (H) 04/06/2019 04:20 PM    Lactic acid 1.4 04/06/2019 04:20 PM      Estimated Creatinine Clearance: 114.6 mL/min (based on SCr of 0.84 mg/dL).      No results found for: Eudelia Bunch    Day 1 of vancomycin.  Goal trough is 10-20.  Will initiate 1250 mg q 12 hours.  Will continue to follow patient.      Thank you,  Janae Sauce, PharmD  Clinical Pharmacy PGY1 Resident   [phone]

## 2019-04-06 NOTE — Progress Notes (Signed)
Patient refused his trazodone and melatonin stating that it does not work for him.

## 2019-04-06 NOTE — Op Note (Signed)
[facility]    Name:  [name], [name]  MR#:  [id number]  DOB:  1976-09-14  ACCOUNT #:  [id number]  DATE OF SERVICE:  04/07/2019    PREOPERATIVE DIAGNOSIS:  Acute suppurative extensor tenosynovitis of the right middle finger with abscess.    POSTOPERATIVE DIAGNOSIS:  Acute suppurative extensor tenosynovitis of the right middle finger with abscess.    PROCEDURE PERFORMED:  Incision and drainage with tenosynovectomy of the extensor tendon of the right middle finger.    SURGEON:  Emilie Rutter. Sheliah Hatch, MD    ASSISTANT:  None.    ANESTHESIA:  General anesthesia with block.    COMPLICATIONS:  None.    SPECIMENS REMOVED:  Culture.    IMPLANTS: .    ESTIMATED BLOOD LOSS:  Minimal.    DRAINS:  Iodoform gauze packing.    HOSPITAL COURSE:  This is a 43 year old male patient who presented with an abscess on the dorsum of his right middle finger without history of known injury, who presented with multiple other small infections in his legs.  He then presents for surgical intervention because of the fluctuance on the posterior aspect of his finger along the dorsum.    PROCEDURE:  The patient was placed in supine position under a regional and general anesthesia and the arm prepared, draped, presented as surgical field and time-out process performed.  A gently curved incision over the dorsum of the finger was then carried through subcutaneous tissues down to the subcutaneous tissues where a relatively large abscess and extensor tenosynovitis noted from the level of the metacarpophalangeal joint dorsally and distally to the distal interphalangeal joint.  Copious irrigation was performed and a curette then used to debride along with a scalpel.  The extensor mechanism and extensor tenosynovectomy performed with debridement of this area.  It was carefully and copiously irrigated once again.  No significant purulence could be found and it did not appear that this was exuding from any joint surface.   The wound was then copiously irrigated one last time and closed loosely with two 4-0 nylon sutures.  Packing utilizing of iodoform gauze was then packed into the wound.  A bulky dressing was then applied and the tourniquet released.  The patient taken to recovery room having tolerated the procedure quite nicely.  Tourniquet time was approximately 18 minutes.      Camille Bal, MD      [id number]  D:  04/08/2019 14:20  T:  04/08/2019 23:33  JOB #:  [id number]
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
## 2019-04-06 NOTE — Consults (Signed)
FRACTURE CONSULT NOTE    Subjective:     Date of Consultation:  April 06, 2019  Referring Physician:  Dial    Jeremy Jackson is a 43 y.o. male who is being seen for right hand and finger pain. Patient noted 5 day history of pain in middle finger. Denies specific injury. Also noted areas on right leg with pustules  Evaluated at [facility] yesterday and refused treatment and admission  There are no active problems to display for this patient.    History reviewed. No pertinent family history.   Social History     Tobacco Use   ??? Smoking status: Not on file   Substance Use Topics   ??? Alcohol use: Not on file     History reviewed. No pertinent past medical history.   History reviewed. No pertinent surgical history.   Prior to Admission medications    Medication Sig Start Date End Date Taking? Authorizing Provider   traZODone (DESYREL) 100 mg tablet Take 100 mg by mouth nightly.    Provider, Historical   melatonin 3 mg tablet Take 3 mg by mouth.    Provider, Historical   diphenhydrAMINE (BENADRYL ALLERGY) 25 mg tablet Take 25 mg by mouth every six (6) hours as needed for Sleep.    Provider, Historical   ibuprofen (MOTRIN) 200 mg tablet Take  by mouth.    Provider, Historical     Current Facility-Administered Medications   Medication Dose Route Frequency   ??? vancomycin (VANCOCIN) 1250 mg in NS 250 ml infusion  1,250 mg IntraVENous ONCE   ??? piperacillin-tazobactam (ZOSYN) 4.5 g in 0.9% sodium chloride (MBP/ADV) 100 mL  4.5 g IntraVENous ONCE     Current Outpatient Medications   Medication Sig   ??? traZODone (DESYREL) 100 mg tablet Take 100 mg by mouth nightly.   ??? melatonin 3 mg tablet Take 3 mg by mouth.   ??? diphenhydrAMINE (BENADRYL ALLERGY) 25 mg tablet Take 25 mg by mouth every six (6) hours as needed for Sleep.   ??? ibuprofen (MOTRIN) 200 mg tablet Take  by mouth.      Allergies   Allergen Reactions   ??? Pcn [Penicillins] Swelling        Review of Systems:  Pertinent items are noted in HPI.    Mental Status: no  dementia    Objective:     Patient Vitals for the past 8 hrs:   BP Temp Pulse Resp SpO2 Height Weight   04/06/19 1613 128/77 98.4 ??F (36.9 ??C) (!) 108 20 97 % 5\' 9"  (1.753 m) 74.8 kg (165 lb)     Temp (24hrs), Avg:98.4 ??F (36.9 ??C), Min:98.4 ??F (36.9 ??C), Max:98.4 ??F (36.9 ??C)      EXAM:   General:  Alert, cooperative, well noursished, well developed, appears stated age   Eyes:  Sclera anicteric. PERRL   Mouth/Throat: Mucous membranes normal, oral pharynx clear   Neck: Supple   Lungs:   Clear to auscultation bilaterally, good effort   CV:  Regular rate and rhythm,no murmur, click, rub or gallop   Abdomen:   Soft, non-tender. bowel sounds normal. non-distended   Extremities: Right middle finger with extensor surface significantly swollenand reddened.  Motion limited. Fusiform swelling. No palmar tenderness  Extensor surface swollen at MP, PIP   Skin: Skin color, texture, turgor normal. no acute rash or lesions   Lymph nodes: Cervical and supraclavicular normal   Musculoskeletal:  Right lower ext with multiple blebs with eschar over leg  and thigh   Lines/Devices:  Intact, no erythema, drainage or tenderness   Psych: Alert and oriented, normal mood affect given the setting         Imaging Review:     Labs: No results found for this or any previous visit (from the past 24 hour(s)).      Impression:     Active Problems:    * No active hospital problems. *    Extensor tenosynovitis right middle finger  Plan:   I and D. Patient recently has eaten and will undergo procedure tomorrow  Begin IV antibiotics per hospitalist  Procedure explained in detail      Camille BalMark W Earnest Mcgillis, DO
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
## 2019-04-07 LAB — CBC WITH AUTOMATED DIFF
ABS. BASOPHILS: 0 10*3/uL (ref 0.0–0.2)
ABS. EOSINOPHILS: 0.2 10*3/uL (ref 0.0–0.8)
ABS. IMM. GRANS.: 0.1 10*3/uL (ref 0.0–0.5)
ABS. LYMPHOCYTES: 2.6 10*3/uL (ref 0.5–4.6)
ABS. MONOCYTES: 1.2 10*3/uL (ref 0.1–1.3)
ABS. NEUTROPHILS: 12 10*3/uL — ABNORMAL HIGH (ref 1.7–8.2)
ABSOLUTE NRBC: 0 10*3/uL (ref 0.0–0.2)
BASOPHILS: 0 % (ref 0.0–2.0)
EOSINOPHILS: 1 % (ref 0.5–7.8)
HCT: 34 % — ABNORMAL LOW (ref 41.1–50.3)
HGB: 11.8 g/dL — ABNORMAL LOW (ref 13.6–17.2)
IMMATURE GRANULOCYTES: 1 % (ref 0.0–5.0)
LYMPHOCYTES: 16 % (ref 13–44)
MCH: 29.9 PG (ref 26.1–32.9)
MCHC: 34.7 g/dL (ref 31.4–35.0)
MCV: 86.1 FL (ref 79.6–97.8)
MONOCYTES: 7 % (ref 4.0–12.0)
MPV: 9.4 FL (ref 9.4–12.3)
NEUTROPHILS: 75 % (ref 43–78)
PLATELET: 334 10*3/uL (ref 150–450)
RBC: 3.95 M/uL — ABNORMAL LOW (ref 4.23–5.6)
RDW: 12.6 % (ref 11.9–14.6)
WBC: 16.1 10*3/uL — ABNORMAL HIGH (ref 4.3–11.1)

## 2019-04-07 LAB — METABOLIC PANEL, COMPREHENSIVE
A-G Ratio: 0.6 — ABNORMAL LOW (ref 1.2–3.5)
ALT (SGPT): 19 U/L (ref 12–65)
AST (SGOT): 14 U/L — ABNORMAL LOW (ref 15–37)
Albumin: 2.6 g/dL — ABNORMAL LOW (ref 3.5–5.0)
Alk. phosphatase: 97 U/L (ref 50–136)
Anion gap: 7 mmol/L (ref 7–16)
BUN: 13 MG/DL (ref 6–23)
Bilirubin, total: 0.3 MG/DL (ref 0.2–1.1)
CO2: 28 mmol/L (ref 21–32)
Calcium: 8.6 MG/DL (ref 8.3–10.4)
Chloride: 104 mmol/L (ref 98–107)
Creatinine: 0.82 MG/DL (ref 0.8–1.5)
GFR est AA: >60 mL/min/{1.73_m2} (ref >60)
GFR est non-AA: >60 mL/min/{1.73_m2} (ref >60)
Globulin: 4.1 g/dL — ABNORMAL HIGH (ref 2.3–3.5)
Glucose: 94 mg/dL (ref 65–100)
Potassium: 4.2 mmol/L (ref 3.5–5.1)
Protein, total: 6.7 g/dL (ref 6.3–8.2)
Sodium: 139 mmol/L (ref 136–145)

## 2019-04-07 LAB — EKG, 12 LEAD, INITIAL
Atrial Rate: 87 {beats}/min
Calculated P Axis: 75 degrees
Calculated R Axis: 95 degrees
Calculated T Axis: 45 degrees
Diagnosis: NORMAL
P-R Interval: 172 ms
Q-T Interval: 348 ms
QRS Duration: 90 ms
QTC Calculation (Bezet): 418 ms
Ventricular Rate: 87 {beats}/min

## 2019-04-07 LAB — PTT: aPTT: 35.2 s (ref 24.3–35.4)

## 2019-04-07 LAB — PROTHROMBIN TIME + INR
INR: 1
Prothrombin time: 13.5 s (ref 12.0–14.7)

## 2019-04-07 LAB — CBC WITH AUTO DIFFERENTIAL
Basophils %: 0 % (ref 0.0–2.0)
Basophils Absolute: 0 10*3/uL (ref 0.0–0.2)
Eosinophils %: 1 % (ref 0.5–7.8)
Eosinophils Absolute: 0.2 10*3/uL (ref 0.0–0.8)
Granulocyte Absolute Count: 0.1 10*3/uL (ref 0.0–0.5)
Hematocrit: 34 % — ABNORMAL LOW (ref 41.1–50.3)
Hemoglobin: 11.8 g/dL — ABNORMAL LOW (ref 13.6–17.2)
Immature Granulocytes: 1 % (ref 0.0–5.0)
Lymphocytes %: 16 % (ref 13–44)
Lymphocytes Absolute: 2.6 10*3/uL (ref 0.5–4.6)
MCH: 29.9 PG (ref 26.1–32.9)
MCHC: 34.7 g/dL (ref 31.4–35.0)
MCV: 86.1 FL (ref 79.6–97.8)
MPV: 9.4 FL (ref 9.4–12.3)
Monocytes %: 7 % (ref 4.0–12.0)
Monocytes Absolute: 1.2 10*3/uL (ref 0.1–1.3)
NRBC Absolute: 0 10*3/uL (ref 0.0–0.2)
Neutrophils %: 75 % (ref 43–78)
Neutrophils Absolute: 12 10*3/uL — ABNORMAL HIGH (ref 1.7–8.2)
Platelets: 334 10*3/uL (ref 150–450)
RBC: 3.95 M/uL — ABNORMAL LOW (ref 4.23–5.6)
RDW: 12.6 % (ref 11.9–14.6)
WBC: 16.1 10*3/uL — ABNORMAL HIGH (ref 4.3–11.1)

## 2019-04-07 LAB — EKG 12-LEAD
Atrial Rate: 87 {beats}/min
Diagnosis: NORMAL
P Axis: 75 degrees
P-R Interval: 172 ms
Q-T Interval: 348 ms
QRS Duration: 90 ms
QTc Calculation (Bazett): 418 ms
R Axis: 95 degrees
T Axis: 45 degrees
Ventricular Rate: 87 {beats}/min

## 2019-04-07 LAB — APTT: aPTT: 35.2 s (ref 24.3–35.4)

## 2019-04-07 LAB — COMPREHENSIVE METABOLIC PANEL
ALT: 19 U/L (ref 12–65)
AST: 14 U/L — ABNORMAL LOW (ref 15–37)
Albumin/Globulin Ratio: 0.6 — ABNORMAL LOW (ref 1.2–3.5)
Albumin: 2.6 g/dL — ABNORMAL LOW (ref 3.5–5.0)
Alkaline Phosphatase: 97 U/L (ref 50–136)
Anion Gap: 7 mmol/L (ref 7–16)
BUN: 13 MG/DL (ref 6–23)
CO2: 28 mmol/L (ref 21–32)
Calcium: 8.6 MG/DL (ref 8.3–10.4)
Chloride: 104 mmol/L (ref 98–107)
Creatinine: 0.82 MG/DL (ref 0.8–1.5)
EGFR IF NonAfrican American: >60 mL/min/{1.73_m2} (ref >60)
GFR African American: >60 mL/min/{1.73_m2} (ref >60)
Globulin: 4.1 g/dL — ABNORMAL HIGH (ref 2.3–3.5)
Glucose: 94 mg/dL (ref 65–100)
Potassium: 4.2 mmol/L (ref 3.5–5.1)
Sodium: 139 mmol/L (ref 136–145)
Total Bilirubin: 0.3 MG/DL (ref 0.2–1.1)
Total Protein: 6.7 g/dL (ref 6.3–8.2)

## 2019-04-07 LAB — PROTIME-INR
INR: 1
Protime: 13.5 s (ref 12.0–14.7)

## 2019-04-07 MED ORDER — NICOTINE 21 MG/24 HR DAILY PATCH
21 mg/24 hr | TRANSDERMAL | Status: DC
Start: 2019-04-07 — End: 2019-04-07
  Administered 2019-04-07: 19:00:00 via TRANSDERMAL

## 2019-04-07 MED ORDER — SODIUM CHLORIDE 0.9 % INJECTION
INTRAMUSCULAR | Status: AC
Start: 2019-04-07 — End: ?

## 2019-04-07 MED ORDER — MIDAZOLAM 1 MG/ML IJ SOLN
1 mg/mL | Freq: Once | INTRAMUSCULAR | Status: DC | PRN
Start: 2019-04-07 — End: 2019-04-07

## 2019-04-07 MED ORDER — HYDROCODONE-ACETAMINOPHEN 5 MG-325 MG TAB
5-325 mg | ORAL | Status: DC | PRN
Start: 2019-04-07 — End: 2019-04-07

## 2019-04-07 MED ORDER — LIDOCAINE (PF) 20 MG/ML (2 %) IJ SOLN
20 mg/mL (2 %) | INTRAMUSCULAR | Status: DC | PRN
Start: 2019-04-07 — End: 2019-04-07
  Administered 2019-04-07: 14:00:00 via INTRAVENOUS

## 2019-04-07 MED ORDER — SODIUM CHLORIDE 0.9 % IJ SYRG
INTRAMUSCULAR | Status: DC | PRN
Start: 2019-04-07 — End: 2019-04-09

## 2019-04-07 MED ORDER — PROPOFOL 10 MG/ML IV EMUL
10 mg/mL | INTRAVENOUS | Status: DC | PRN
Start: 2019-04-07 — End: 2019-04-07
  Administered 2019-04-07 (×7): via INTRAVENOUS

## 2019-04-07 MED ORDER — BUPIVACAINE (PF) 0.5 % (5 MG/ML) IJ SOLN
0.5 % (5 mg/mL) | INTRAMUSCULAR | Status: DC | PRN
Start: 2019-04-07 — End: 2019-04-07
  Administered 2019-04-07: 14:00:00 via SUBCUTANEOUS

## 2019-04-07 MED ORDER — SODIUM CHLORIDE 0.9 % IV
INTRAVENOUS | Status: DC
Start: 2019-04-07 — End: 2019-04-09
  Administered 2019-04-07 – 2019-04-09 (×6): via INTRAVENOUS

## 2019-04-07 MED ORDER — ALCOHOL 62% (NOZIN) NASAL SANITIZER
62 % | Freq: Two times a day (BID) | CUTANEOUS | Status: DC
Start: 2019-04-07 — End: 2019-04-09
  Administered 2019-04-07 – 2019-04-09 (×5): via TOPICAL

## 2019-04-07 MED ORDER — PHARMACY VANCOMYCIN NOTE
Freq: Once | Status: AC
Start: 2019-04-07 — End: 2019-04-08
  Administered 2019-04-08: 09:00:00

## 2019-04-07 MED ORDER — MIDAZOLAM 1 MG/ML IJ SOLN
1 mg/mL | Freq: Once | INTRAMUSCULAR | Status: AC
Start: 2019-04-07 — End: 2019-04-07
  Administered 2019-04-07: 13:00:00 via INTRAVENOUS

## 2019-04-07 MED ORDER — OXYCODONE 5 MG TAB
5 mg | Freq: Once | ORAL | Status: DC | PRN
Start: 2019-04-07 — End: 2019-04-07

## 2019-04-07 MED ORDER — LIDOCAINE HCL 1 % (10 MG/ML) IJ SOLN
10 mg/mL (1 %) | INTRAMUSCULAR | Status: DC | PRN
Start: 2019-04-07 — End: 2019-04-07
  Administered 2019-04-07: 14:00:00 via SUBCUTANEOUS

## 2019-04-07 MED ORDER — LIDOCAINE HCL 1 % (10 MG/ML) IJ SOLN
10 mg/mL (1 %) | INTRAMUSCULAR | Status: DC | PRN
Start: 2019-04-07 — End: 2019-04-07

## 2019-04-07 MED ORDER — FENTANYL CITRATE (PF) 50 MCG/ML IJ SOLN
50 mcg/mL | Freq: Once | INTRAMUSCULAR | Status: AC
Start: 2019-04-07 — End: 2019-04-07
  Administered 2019-04-07: 13:00:00 via INTRAVENOUS

## 2019-04-07 MED ORDER — PROPOFOL 10 MG/ML IV EMUL
10 mg/mL | INTRAVENOUS | Status: DC | PRN
Start: 2019-04-07 — End: 2019-04-07
  Administered 2019-04-07 (×3): via INTRAVENOUS

## 2019-04-07 MED ORDER — LIDOCAINE (PF) 20 MG/ML (2 %) IJ SOLN
20 mg/mL (2 %) | INTRAMUSCULAR | Status: AC
Start: 2019-04-07 — End: ?

## 2019-04-07 MED ORDER — ROPIVACAINE 2 MG/ML INJECTION
2 mg/mL (0. %) | INTRAMUSCULAR | Status: AC
Start: 2019-04-07 — End: 2019-04-07
  Administered 2019-04-07: 13:00:00 via PERINEURAL

## 2019-04-07 MED ORDER — DEXAMETHASONE SODIUM PHOSPHATE 4 MG/ML IJ SOLN
4 mg/mL | INTRAMUSCULAR | Status: AC
Start: 2019-04-07 — End: 2019-04-07
  Administered 2019-04-07: 13:00:00 via PERINEURAL

## 2019-04-07 MED ORDER — HYDROMORPHONE (PF) 2 MG/ML IJ SOLN
2 mg/mL | INTRAMUSCULAR | Status: DC | PRN
Start: 2019-04-07 — End: 2019-04-07

## 2019-04-07 MED ORDER — SODIUM CHLORIDE 0.9 % IJ SYRG
Freq: Three times a day (TID) | INTRAMUSCULAR | Status: DC
Start: 2019-04-07 — End: 2019-04-09
  Administered 2019-04-07 – 2019-04-09 (×5): via INTRAVENOUS

## 2019-04-07 MED ORDER — LACTATED RINGERS IV
INTRAVENOUS | Status: DC
Start: 2019-04-07 — End: 2019-04-07
  Administered 2019-04-07: 13:00:00 via INTRAVENOUS

## 2019-04-07 MED ORDER — LACTATED RINGERS IV
INTRAVENOUS | Status: DC
Start: 2019-04-07 — End: 2019-04-07
  Administered 2019-04-07: 15:00:00 via INTRAVENOUS

## 2019-04-07 MED ORDER — PROPOFOL 10 MG/ML IV EMUL
10 mg/mL | INTRAVENOUS | Status: AC
Start: 2019-04-07 — End: ?

## 2019-04-07 MED ORDER — LIDOCAINE (PF) 10 MG/ML (1 %) IJ SOLN
10 mg/mL (1 %) | INTRAMUSCULAR | Status: AC
Start: 2019-04-07 — End: ?

## 2019-04-07 MED ORDER — BUPIVACAINE (PF) 0.5 % (5 MG/ML) IJ SOLN
0.5 % (5 mg/mL) | INTRAMUSCULAR | Status: AC
Start: 2019-04-07 — End: ?

## 2019-04-07 MED ORDER — ROPIVACAINE 10 MG/ML INJECTION
101 mg/mL (1 %) | INTRAMUSCULAR | Status: AC
Start: 2019-04-07 — End: 2019-04-07
  Administered 2019-04-07: 13:00:00 via PERINEURAL

## 2019-04-07 MED ORDER — NICOTINE 21 MG/24 HR DAILY PATCH
21 mg/24 hr | TRANSDERMAL | Status: DC
Start: 2019-04-07 — End: 2019-04-09

## 2019-04-07 MED FILL — MIDAZOLAM 1 MG/ML IJ SOLN: 1 mg/mL | INTRAMUSCULAR | Qty: 5

## 2019-04-07 MED FILL — HYDROCODONE-ACETAMINOPHEN 5 MG-325 MG TAB: 5-325 mg | ORAL | Qty: 1

## 2019-04-07 MED FILL — SENSORCAINE-MPF 0.5 % (5 MG/ML) INJECTION SOLUTION: 0.5 % (5 mg/mL) | INTRAMUSCULAR | Qty: 30

## 2019-04-07 MED FILL — HYDROMORPHONE (PF) 1 MG/ML IJ SOLN: 1 mg/mL | INTRAMUSCULAR | Qty: 1

## 2019-04-07 MED FILL — PIPERACILLIN-TAZOBACTAM 3.375 GRAM IV SOLR: 3.375 gram | INTRAVENOUS | Qty: 3.38

## 2019-04-07 MED FILL — PANTOPRAZOLE 40 MG TAB, DELAYED RELEASE: 40 mg | ORAL | Qty: 1

## 2019-04-07 MED FILL — VANCOMYCIN 10 GRAM IV SOLR: 10 gram | INTRAVENOUS | Qty: 1250

## 2019-04-07 MED FILL — SODIUM CHLORIDE 0.9 % INJECTION: INTRAMUSCULAR | Qty: 10

## 2019-04-07 MED FILL — SODIUM CHLORIDE 0.9 % IV: INTRAVENOUS | Qty: 1000

## 2019-04-07 MED FILL — NICOTINE 21 MG/24 HR DAILY PATCH: 21 mg/24 hr | TRANSDERMAL | Qty: 1

## 2019-04-07 MED FILL — XYLOCAINE-MPF 20 MG/ML (2 %) INJECTION SOLUTION: 20 mg/mL (2 %) | INTRAMUSCULAR | Qty: 5

## 2019-04-07 MED FILL — ALCOHOL 62% (NOZIN) NASAL SANITIZER: 62 % | CUTANEOUS | Qty: 1

## 2019-04-07 MED FILL — FENTANYL CITRATE (PF) 50 MCG/ML IJ SOLN: 50 mcg/mL | INTRAMUSCULAR | Qty: 2

## 2019-04-07 MED FILL — ALPRAZOLAM 0.5 MG TAB: 0.5 mg | ORAL | Qty: 1

## 2019-04-07 MED FILL — TRAZODONE 50 MG TAB: 50 mg | ORAL | Qty: 2

## 2019-04-07 MED FILL — PROPOFOL 10 MG/ML IV EMUL: 10 mg/mL | INTRAVENOUS | Qty: 40

## 2019-04-07 MED FILL — PHARMACY VANCOMYCIN NOTE: Qty: 1

## 2019-04-07 MED FILL — LIDOCAINE (PF) 10 MG/ML (1 %) IJ SOLN: 10 mg/mL (1 %) | INTRAMUSCULAR | Qty: 30

## 2019-04-07 NOTE — Progress Notes (Signed)
Patient ciigarette box with six sticks is in  The lockup box.

## 2019-04-07 NOTE — Anesthesia Post-Procedure Evaluation (Signed)
Procedure(s):  INCISION AND DRAINAGE OF RIGHT MIDDLE FINGER INFECTION.    general    Anesthesia Post Evaluation      Multimodal analgesia: multimodal analgesia used between 6 hours prior to anesthesia start to PACU discharge  Patient location during evaluation: bedside  Patient participation: complete - patient participated  Level of consciousness: awake and alert  Pain score: 3  Pain management: adequate  Airway patency: patent  Anesthetic complications: no  Cardiovascular status: acceptable and hemodynamically stable  Respiratory status: acceptable  Hydration status: acceptable  Post anesthesia nausea and vomiting:  none      Vitals Value Taken Time   BP 108/64 04/07/2019 10:24 AM   Temp 37.2 ??C (98.9 ??F) 04/07/2019 10:24 AM   Pulse 82 04/07/2019 10:24 AM   Resp 12 04/07/2019 10:24 AM   SpO2 99 % 04/07/2019 10:24 AM

## 2019-04-07 NOTE — Anesthesia Pre-Procedure Evaluation (Addendum)
Relevant Problems   No relevant active problems       Anesthetic History   No history of anesthetic complications            Review of Systems / Medical History  Patient summary reviewed and pertinent labs reviewed    Pulmonary          Smoker         Neuro/Psych   Within defined limits           Cardiovascular  Within defined limits                Exercise tolerance: >4 METS     GI/Hepatic/Renal  Within defined limits              Endo/Other  Within defined limits           Other Findings   Comments: Drug abuse         Physical Exam    Airway  Mallampati: II  TM Distance: 4 - 6 cm  Neck ROM: normal range of motion   Mouth opening: Normal     Cardiovascular  Regular rate and rhythm,  S1 and S2 normal,  no murmur, click, rub, or gallop  Rhythm: regular           Dental  No notable dental hx       Pulmonary  Breath sounds clear to auscultation               Abdominal         Other Findings            Anesthetic Plan    ASA: 2  Anesthesia type: general      Post-op pain plan if not by surgeon: peripheral nerve block single      Anesthetic plan and risks discussed with: Patient

## 2019-04-07 NOTE — Other (Signed)
TRANSFER - OUT REPORT:    Verbal report given to Anna RN on Jeremy Jackson  being transferred to 727 for routine post - op       Report consisted of patient???s Situation, Background, Assessment and   Recommendations(SBAR).     Information from the following report(s) OR Summary, Procedure Summary, Intake/Output and MAR was reviewed with the receiving nurse.    Lines:   Peripheral IV 04/07/19 Left Wrist (Active)   Site Assessment Clean, dry, & intact 04/07/2019 10:24 AM   Phlebitis Assessment 0 04/07/2019 10:24 AM   Infiltration Assessment 0 04/07/2019 10:24 AM   Dressing Status Clean, dry, & intact 04/07/2019 10:24 AM   Dressing Type Tape;Transparent 04/07/2019 10:24 AM   Hub Color/Line Status Green;Patent 04/07/2019 10:24 AM   Alcohol Cap Used No 04/07/2019 10:24 AM        Opportunity for questions and clarification was provided.      Patient transported with:   oxygen 0L    VTE prophylaxis orders have not been written for [name].

## 2019-04-07 NOTE — Progress Notes (Signed)
Problem: Falls - Risk of  Goal: *Absence of Falls  Description: Document Schmid Fall Risk and appropriate interventions in the flowsheet.  Outcome: Progressing Towards Goal  Note: Fall Risk Interventions:  Mobility Interventions: Communicate number of staff needed for ambulation/transfer         Medication Interventions: Patient to call before getting OOB, Teach patient to arise slowly         History of Falls Interventions: Consult care management for discharge planning, Door open when patient unattended, Investigate reason for fall         Problem: Patient Education: Go to Patient Education Activity  Goal: Patient/Family Education  Outcome: Progressing Towards Goal     Problem: General Medical Care Plan  Goal: *Vital signs within specified parameters  Outcome: Progressing Towards Goal  Goal: *Labs within defined limits  Outcome: Progressing Towards Goal  Goal: *Absence of infection signs and symptoms  Outcome: Progressing Towards Goal  Goal: *Optimal pain control at patient's stated goal  Outcome: Progressing Towards Goal  Goal: *Skin integrity maintained  Outcome: Progressing Towards Goal  Goal: *Fluid volume balance  Outcome: Progressing Towards Goal  Goal: *Optimize nutritional status  Outcome: Progressing Towards Goal  Goal: *Anxiety reduced or absent  Outcome: Progressing Towards Goal  Goal: *Progressive mobility and function (eg: ADL's)  Outcome: Progressing Towards Goal     Problem: Patient Education: Go to Patient Education Activity  Goal: Patient/Family Education  Outcome: Progressing Towards Goal

## 2019-04-07 NOTE — Other (Signed)
Patient found to have xanax and 2 lighters in possession. Lighters placed in biohazard bag and name placed on bag, bag placed in lock box. Xanax counted with Jani Files, RN and locked up in the narcotics box.

## 2019-04-07 NOTE — Anesthesia Procedure Notes (Signed)
Peripheral Block    Start time: 04/07/2019 8:57 AM  End time: 04/07/2019 9:03 AM  Performed by: Horton, Trentyn Boisclair H Jr., MD  Authorized by: Horton, Laila Myhre H Jr., MD       Pre-procedure:   Indications: at surgeon's request, post-op pain management and procedure for pain    Preanesthetic Checklist: patient identified, risks and benefits discussed, site marked, timeout performed, anesthesia consent given and patient being monitored    Timeout Time: 08:56          Block Type:   Block Type:  Supraclavicular  Laterality:  Right  Monitoring:  Standard ASA monitoring, responsive to questions, oxygen, continuous pulse ox, heart rate and frequent vital sign checks  Injection Technique:  Single shot  Procedures: ultrasound guided and nerve stimulator    Patient Position: seated  Prep: chlorhexidine    Location:  Supraclavicular  Needle Type:  Stimuplex  Needle Gauge:  22 G  Needle Localization:  Nerve stimulator and ultrasound guidance  Motor Response: minimal motor response >0.4 mA      Assessment:  Number of attempts:  1  Injection Assessment:  Incremental injection every 5 mL, no paresthesia, ultrasound image on chart, local visualized surrounding nerve on ultrasound, negative aspiration for blood and no intravascular symptoms  Patient tolerance:  Patient tolerated the procedure well with no immediate complications
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
## 2019-04-07 NOTE — Brief Op Note (Signed)
Brief Postoperative Note    Patient: Mayfield David Bernards  Date of Birth: 04/09/1976  MRN: [id number]    Date of Procedure: 04/07/2019     Pre-Op Diagnosis: Infected middle finger right hand    Post-Op Diagnosis: Same    Procedure(s):  INCISION AND DRAINAGE OF RIGHT MIDDLE FINGER INFECTION    Surgeon(s):  Genelle Economou W, DO    Surgical Assistant: None    Anesthesia: General     Estimated Blood Loss (mL): Minimal    Complications: none    Specimens:   ID Type Source Tests Collected by Time Destination   1 : Right Middle Finger wound Wound Finger CULTURE, ANAEROBIC, CULTURE, WOUND W GRAM STAIN Keiton Cosma W, DO 04/07/2019 1005 Microbiology        Implants: * No implants in log *    Drains: * No LDAs found *    Findings: as described  Electronically Signed by Shadell Brenn W Clela Hagadorn, DO on 04/07/2019 at 10:45 AM
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
## 2019-04-07 NOTE — Progress Notes (Signed)
Problem: Falls - Risk of  Goal: *Absence of Falls  Description: Document Schmid Fall Risk and appropriate interventions in the flowsheet.  Outcome: Progressing Towards Goal  Note: Fall Risk Interventions:  Mobility Interventions: Bed/chair exit alarm         Medication Interventions: Bed/chair exit alarm                   Problem: Patient Education: Go to Patient Education Activity  Goal: Patient/Family Education  Outcome: Progressing Towards Goal     Problem: General Medical Care Plan  Goal: *Vital signs within specified parameters  Outcome: Progressing Towards Goal  Goal: *Labs within defined limits  Outcome: Progressing Towards Goal  Goal: *Absence of infection signs and symptoms  Outcome: Progressing Towards Goal  Goal: *Optimal pain control at patient's stated goal  Outcome: Progressing Towards Goal  Goal: *Skin integrity maintained  Outcome: Progressing Towards Goal  Goal: *Fluid volume balance  Outcome: Progressing Towards Goal  Goal: *Optimize nutritional status  Outcome: Progressing Towards Goal  Goal: *Anxiety reduced or absent  Outcome: Progressing Towards Goal  Goal: *Progressive mobility and function (eg: ADL's)  Outcome: Progressing Towards Goal     Problem: Patient Education: Go to Patient Education Activity  Goal: Patient/Family Education  Outcome: Progressing Towards Goal

## 2019-04-07 NOTE — Progress Notes (Signed)
Reviewing notes of patient post op    Jeremy Jackson, staff chaplain, MDiv, [facility]  C:[phone]  /   [email or web address]

## 2019-04-07 NOTE — Progress Notes (Signed)
Problem: Falls - Risk of  Goal: *Absence of Falls  Description: Document Jeremy Jackson Fall Risk and appropriate interventions in the flowsheet.  Outcome: Progressing Towards Goal  Note: Fall Risk Interventions:  Mobility Interventions: Bed/chair exit alarm         Medication Interventions: Bed/chair exit alarm                   Problem: Patient Education: Go to Patient Education Activity  Goal: Patient/Family Education  Outcome: Progressing Towards Goal     Problem: General Medical Care Plan  Goal: *Vital signs within specified parameters  Outcome: Progressing Towards Goal  Goal: *Labs within defined limits  Outcome: Progressing Towards Goal  Goal: *Absence of infection signs and symptoms  Outcome: Progressing Towards Goal  Goal: *Optimal pain control at patient's stated goal  Outcome: Progressing Towards Goal  Goal: *Skin integrity maintained  Outcome: Progressing Towards Goal  Goal: *Fluid volume balance  Outcome: Progressing Towards Goal  Goal: *Optimize nutritional status  Outcome: Progressing Towards Goal  Goal: *Anxiety reduced or absent  Outcome: Progressing Towards Goal  Goal: *Progressive mobility and function (eg: ADL's)  Outcome: Progressing Towards Goal     Problem: Patient Education: Go to Patient Education Activity  Goal: Patient/Family Education  Outcome: Progressing Towards Goal

## 2019-04-07 NOTE — Interval H&P Note (Signed)
TRANSFER - OUT REPORT:    Verbal report given to Tobi Bastos RN on Kyeson Jemmott  being transferred to 727 for routine post - op       Report consisted of patient's Situation, Background, Assessment and   Recommendations(SBAR).     Information from the following report(s) OR Summary, Procedure Summary, Intake/Output and MAR was reviewed with the receiving nurse.    Lines:   Peripheral IV 04/07/19 Left Wrist (Active)   Site Assessment Clean, dry, & intact 04/07/2019 10:24 AM   Phlebitis Assessment 0 04/07/2019 10:24 AM   Infiltration Assessment 0 04/07/2019 10:24 AM   Dressing Status Clean, dry, & intact 04/07/2019 10:24 AM   Dressing Type Tape;Transparent 04/07/2019 10:24 AM   Hub Color/Line Status Green;Patent 04/07/2019 10:24 AM   Alcohol Cap Used No 04/07/2019 10:24 AM        Opportunity for questions and clarification was provided.      Patient transported with:   oxygen 0L    VTE prophylaxis orders have not been written for Cliffton Asters.

## 2019-04-07 NOTE — Progress Notes (Signed)
Problem: Falls - Risk of  Goal: *Absence of Falls  Description: Document Jeremy Jackson Fall Risk and appropriate interventions in the flowsheet.  Outcome: Progressing Towards Goal  Note: Fall Risk Interventions:  Mobility Interventions: Communicate number of staff needed for ambulation/transfer         Medication Interventions: Patient to call before getting OOB, Teach patient to arise slowly         History of Falls Interventions: Consult care management for discharge planning, Door open when patient unattended, Investigate reason for fall         Problem: Patient Education: Go to Patient Education Activity  Goal: Patient/Family Education  Outcome: Progressing Towards Goal     Problem: General Medical Care Plan  Goal: *Vital signs within specified parameters  Outcome: Progressing Towards Goal  Goal: *Labs within defined limits  Outcome: Progressing Towards Goal  Goal: *Absence of infection signs and symptoms  Outcome: Progressing Towards Goal  Goal: *Optimal pain control at patient's stated goal  Outcome: Progressing Towards Goal  Goal: *Skin integrity maintained  Outcome: Progressing Towards Goal  Goal: *Fluid volume balance  Outcome: Progressing Towards Goal  Goal: *Optimize nutritional status  Outcome: Progressing Towards Goal  Goal: *Anxiety reduced or absent  Outcome: Progressing Towards Goal  Goal: *Progressive mobility and function (eg: ADL's)  Outcome: Progressing Towards Goal     Problem: Patient Education: Go to Patient Education Activity  Goal: Patient/Family Education  Outcome: Progressing Towards Goal
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
## 2019-04-07 NOTE — Anesthesia Pre-Procedure Evaluation (Signed)
Relevant Problems   No relevant active problems       Anesthetic History   No history of anesthetic complications            Review of Systems / Medical History  Patient summary reviewed and pertinent labs reviewed    Pulmonary          Smoker         Neuro/Psych   Within defined limits           Cardiovascular  Within defined limits                Exercise tolerance: >4 METS     GI/Hepatic/Renal  Within defined limits              Endo/Other  Within defined limits           Other Findings   Comments: Drug abuse         Physical Exam    Airway  Mallampati: II  TM Distance: 4 - 6 cm  Neck ROM: normal range of motion   Mouth opening: Normal     Cardiovascular  Regular rate and rhythm,  S1 and S2 normal,  no murmur, click, rub, or gallop  Rhythm: regular           Dental  No notable dental hx       Pulmonary  Breath sounds clear to auscultation               Abdominal         Other Findings            Anesthetic Plan    ASA: 2  Anesthesia type: general      Post-op pain plan if not by surgeon: peripheral nerve block single      Anesthetic plan and risks discussed with: Patient

## 2019-04-07 NOTE — Progress Notes (Signed)
Patient ciigarette box with six sticks is in  The lockup box.

## 2019-04-07 NOTE — Op Note (Signed)
Brief Postoperative Note    Patient: Tyeson Teitel  Date of Birth: [birth year]  MRN: [id number]    Date of Procedure: 04/07/2019     Pre-Op Diagnosis: Infected middle finger right hand    Post-Op Diagnosis: Same    Procedure(s):  INCISION AND DRAINAGE OF RIGHT MIDDLE FINGER INFECTION    Surgeon(s):  Camille Bal, DO    Surgical Assistant: None    Anesthesia: General     Estimated Blood Loss (mL): Minimal    Complications: none    Specimens:   ID Type Source Tests Collected by Time Destination   1 : Right Middle Finger wound Wound Finger CULTURE, ANAEROBIC, CULTURE, WOUND W GRAM STAIN Camille Bal, DO 04/07/2019 1005 Microbiology        Implants: * No implants in log *    Drains: * No LDAs found *    Findings: as described  Electronically Signed by Camille Bal, DO on 04/07/2019 at 10:45 AM

## 2019-04-07 NOTE — Anesthesia Procedure Notes (Signed)
Peripheral Block    Start time: 04/07/2019 8:57 AM  End time: 04/07/2019 9:03 AM  Performed by: Derry Lory., MD  Authorized by: Derry Lory., MD       Pre-procedure:   Indications: at surgeon's request, post-op pain management and procedure for pain    Preanesthetic Checklist: patient identified, risks and benefits discussed, site marked, timeout performed, anesthesia consent given and patient being monitored    Timeout Time: 08:56          Block Type:   Block Type:  Supraclavicular  Laterality:  Right  Monitoring:  Standard ASA monitoring, responsive to questions, oxygen, continuous pulse ox, heart rate and frequent vital sign checks  Injection Technique:  Single shot  Procedures: ultrasound guided and nerve stimulator    Patient Position: seated  Prep: chlorhexidine    Location:  Supraclavicular  Needle Type:  Stimuplex  Needle Gauge:  22 G  Needle Localization:  Nerve stimulator and ultrasound guidance  Motor Response: minimal motor response >0.4 mA      Assessment:  Number of attempts:  1  Injection Assessment:  Incremental injection every 5 mL, no paresthesia, ultrasound image on chart, local visualized surrounding nerve on ultrasound, negative aspiration for blood and no intravascular symptoms  Patient tolerance:  Patient tolerated the procedure well with no immediate complications

## 2019-04-07 NOTE — Anesthesia Post-Procedure Evaluation (Signed)
Procedure(s):  INCISION AND DRAINAGE OF RIGHT MIDDLE FINGER INFECTION.    general    Anesthesia Post Evaluation      Multimodal analgesia: multimodal analgesia used between 6 hours prior to anesthesia start to PACU discharge  Patient location during evaluation: bedside  Patient participation: complete - patient participated  Level of consciousness: awake and alert  Pain score: 3  Pain management: adequate  Airway patency: patent  Anesthetic complications: no  Cardiovascular status: acceptable and hemodynamically stable  Respiratory status: acceptable  Hydration status: acceptable  Post anesthesia nausea and vomiting:  none      Vitals Value Taken Time   BP 108/64 04/07/2019 10:24 AM   Temp 37.2 ??C (98.9 ??F) 04/07/2019 10:24 AM   Pulse 82 04/07/2019 10:24 AM   Resp 12 04/07/2019 10:24 AM   SpO2 99 % 04/07/2019 10:24 AM
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
## 2019-04-08 LAB — CBC WITH AUTOMATED DIFF
ABS. BASOPHILS: 0 10*3/uL (ref 0.0–0.2)
ABS. EOSINOPHILS: 0.1 10*3/uL (ref 0.0–0.8)
ABS. IMM. GRANS.: 0.1 10*3/uL (ref 0.0–0.5)
ABS. LYMPHOCYTES: 3.1 10*3/uL (ref 0.5–4.6)
ABS. MONOCYTES: 1.1 10*3/uL (ref 0.1–1.3)
ABS. NEUTROPHILS: 12.1 10*3/uL — ABNORMAL HIGH (ref 1.7–8.2)
ABSOLUTE NRBC: 0 10*3/uL (ref 0.0–0.2)
BASOPHILS: 0 % (ref 0.0–2.0)
EOSINOPHILS: 1 % (ref 0.5–7.8)
HCT: 36.5 % — ABNORMAL LOW (ref 41.1–50.3)
HGB: 12.8 g/dL — ABNORMAL LOW (ref 13.6–17.2)
IMMATURE GRANULOCYTES: 1 % (ref 0.0–5.0)
LYMPHOCYTES: 19 % (ref 13–44)
MCH: 30 PG (ref 26.1–32.9)
MCHC: 35.1 g/dL — ABNORMAL HIGH (ref 31.4–35.0)
MCV: 85.5 FL (ref 79.6–97.8)
MONOCYTES: 6 % (ref 4.0–12.0)
MPV: 8.9 FL — ABNORMAL LOW (ref 9.4–12.3)
NEUTROPHILS: 73 % (ref 43–78)
PLATELET: 348 10*3/uL (ref 150–450)
RBC: 4.27 M/uL (ref 4.23–5.6)
RDW: 12.7 % (ref 11.9–14.6)
WBC: 16.6 10*3/uL — ABNORMAL HIGH (ref 4.3–11.1)

## 2019-04-08 LAB — METABOLIC PANEL, BASIC
Anion gap: 6 mmol/L — ABNORMAL LOW (ref 7–16)
BUN: 12 MG/DL (ref 6–23)
CO2: 30 mmol/L (ref 21–32)
Calcium: 8.7 MG/DL (ref 8.3–10.4)
Chloride: 102 mmol/L (ref 98–107)
Creatinine: 0.91 MG/DL (ref 0.8–1.5)
GFR est AA: >60 mL/min/{1.73_m2} (ref >60)
GFR est non-AA: >60 mL/min/{1.73_m2} (ref >60)
Glucose: 112 mg/dL — ABNORMAL HIGH (ref 65–100)
Potassium: 4.1 mmol/L (ref 3.5–5.1)
Sodium: 138 mmol/L (ref 136–145)

## 2019-04-08 LAB — VANCOMYCIN, TROUGH: Vancomycin,trough: 6.5 ug/mL (ref 5–20)

## 2019-04-08 LAB — CBC WITH AUTO DIFFERENTIAL
Basophils %: 0 % (ref 0.0–2.0)
Basophils Absolute: 0 10*3/uL (ref 0.0–0.2)
Eosinophils %: 1 % (ref 0.5–7.8)
Eosinophils Absolute: 0.1 10*3/uL (ref 0.0–0.8)
Granulocyte Absolute Count: 0.1 10*3/uL (ref 0.0–0.5)
Hematocrit: 36.5 % — ABNORMAL LOW (ref 41.1–50.3)
Hemoglobin: 12.8 g/dL — ABNORMAL LOW (ref 13.6–17.2)
Immature Granulocytes: 1 % (ref 0.0–5.0)
Lymphocytes %: 19 % (ref 13–44)
Lymphocytes Absolute: 3.1 10*3/uL (ref 0.5–4.6)
MCH: 30 PG (ref 26.1–32.9)
MCHC: 35.1 g/dL — ABNORMAL HIGH (ref 31.4–35.0)
MCV: 85.5 FL (ref 79.6–97.8)
MPV: 8.9 FL — ABNORMAL LOW (ref 9.4–12.3)
Monocytes %: 6 % (ref 4.0–12.0)
Monocytes Absolute: 1.1 10*3/uL (ref 0.1–1.3)
NRBC Absolute: 0 10*3/uL (ref 0.0–0.2)
Neutrophils %: 73 % (ref 43–78)
Neutrophils Absolute: 12.1 10*3/uL — ABNORMAL HIGH (ref 1.7–8.2)
Platelets: 348 10*3/uL (ref 150–450)
RBC: 4.27 M/uL (ref 4.23–5.6)
RDW: 12.7 % (ref 11.9–14.6)
WBC: 16.6 10*3/uL — ABNORMAL HIGH (ref 4.3–11.1)

## 2019-04-08 LAB — BASIC METABOLIC PANEL
Anion Gap: 6 mmol/L — ABNORMAL LOW (ref 7–16)
BUN: 12 MG/DL (ref 6–23)
CO2: 30 mmol/L (ref 21–32)
Calcium: 8.7 MG/DL (ref 8.3–10.4)
Chloride: 102 mmol/L (ref 98–107)
Creatinine: 0.91 MG/DL (ref 0.8–1.5)
EGFR IF NonAfrican American: >60 mL/min/{1.73_m2} (ref >60)
GFR African American: >60 mL/min/{1.73_m2} (ref >60)
Glucose: 112 mg/dL — ABNORMAL HIGH (ref 65–100)
Potassium: 4.1 mmol/L (ref 3.5–5.1)
Sodium: 138 mmol/L (ref 136–145)

## 2019-04-08 LAB — VANCOMYCIN TROUGH: Vancomycin Tr: 6.5 ug/mL (ref 5–20)

## 2019-04-08 MED ORDER — ADV ADDAPTOR
1000 mg | Freq: Three times a day (TID) | Status: DC
Start: 2019-04-08 — End: 2019-04-09
  Administered 2019-04-08 – 2019-04-09 (×3): via INTRAVENOUS

## 2019-04-08 MED FILL — PIPERACILLIN-TAZOBACTAM 3.375 GRAM IV SOLR: 3.375 gram | INTRAVENOUS | Qty: 3.38

## 2019-04-08 MED FILL — ALCOHOL 62% (NOZIN) NASAL SANITIZER: 62 % | CUTANEOUS | Qty: 1

## 2019-04-08 MED FILL — VANCOMYCIN 1,000 MG IV SOLR: 1000 mg | INTRAVENOUS | Qty: 1000

## 2019-04-08 MED FILL — HYDROMORPHONE (PF) 1 MG/ML IJ SOLN: 1 mg/mL | INTRAMUSCULAR | Qty: 1

## 2019-04-08 MED FILL — HYDROCODONE-ACETAMINOPHEN 5 MG-325 MG TAB: 5-325 mg | ORAL | Qty: 1

## 2019-04-08 MED FILL — PANTOPRAZOLE 40 MG TAB, DELAYED RELEASE: 40 mg | ORAL | Qty: 1

## 2019-04-08 MED FILL — TRAZODONE 50 MG TAB: 50 mg | ORAL | Qty: 2

## 2019-04-08 MED FILL — ALPRAZOLAM 0.5 MG TAB: 0.5 mg | ORAL | Qty: 1

## 2019-04-08 MED FILL — VANCOMYCIN 10 GRAM IV SOLR: 10 gram | INTRAVENOUS | Qty: 1250

## 2019-04-08 MED FILL — NICOTINE 21 MG/24 HR DAILY PATCH: 21 mg/24 hr | TRANSDERMAL | Qty: 1

## 2019-04-08 NOTE — Progress Notes (Signed)
Pharmacokinetic Consult to Pharmacist    Jeremy Jackson is a 42 y.o. male being treated for SSTI with vancomycin.    Height: 5' 9" (175.3 cm)  Weight: 74.8 kg (165 lb)  Lab Results   Component Value Date/Time    BUN 12 04/08/2019 04:46 AM    Creatinine 0.91 04/08/2019 04:46 AM    WBC 16.6 (H) 04/08/2019 04:46 AM    Lactic acid 1.4 04/06/2019 04:20 PM      Estimated Creatinine Clearance: 105.7 mL/min (based on SCr of 0.91 mg/dL).      Lab Results   Component Value Date/Time    Vancomycin,trough 6.5 04/08/2019 04:46 AM       Day 3 of vancomycin.  Goal trough is 10-20.  Will initiate increase the dose to 1000 mg every 8 hours.  Will continue to follow patient.      Thank you,  Ryan Lally, PharmD, BCPS  Clinical Pharmacy Specialist  [phone]
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
## 2019-04-08 NOTE — Op Note (Signed)
[facility]    Name:  GRAISYN, BULLERS DAVID  MR#:  [id number]  DOB:  01-Dec-1976  ACCOUNT #:  [id number]  DATE OF SERVICE:  04/07/2019    PREOPERATIVE DIAGNOSIS:  Acute suppurative extensor tenosynovitis of the right middle finger with abscess.    POSTOPERATIVE DIAGNOSIS:  Acute suppurative extensor tenosynovitis of the right middle finger with abscess.    PROCEDURE PERFORMED:  Incision and drainage with tenosynovectomy of the extensor tendon of the right middle finger.    SURGEON:  Emilie Rutter. Sheliah Hatch, MD    ASSISTANT:  None.    ANESTHESIA:  General anesthesia with block.    COMPLICATIONS:  None.    SPECIMENS REMOVED:  Culture.    IMPLANTS: .    ESTIMATED BLOOD LOSS:  Minimal.    DRAINS:  Iodoform gauze packing.    HOSPITAL COURSE:  This is a 43 year old male patient who presented with an abscess on the dorsum of his right middle finger without history of known injury, who presented with multiple other small infections in his legs.  He then presents for surgical intervention because of the fluctuance on the posterior aspect of his finger along the dorsum.    PROCEDURE:  The patient was placed in supine position under a regional and general anesthesia and the arm prepared, draped, presented as surgical field and time-out process performed.  A gently curved incision over the dorsum of the finger was then carried through subcutaneous tissues down to the subcutaneous tissues where a relatively large abscess and extensor tenosynovitis noted from the level of the metacarpophalangeal joint dorsally and distally to the distal interphalangeal joint.  Copious irrigation was performed and a curette then used to debride along with a scalpel.  The extensor mechanism and extensor tenosynovectomy performed with debridement of this area.  It was carefully and copiously irrigated once again.  No significant purulence could be found and it did not appear  that this was exuding from any joint surface.  The wound was then copiously irrigated one last time and closed loosely with two 4-0 nylon sutures.  Packing utilizing of iodoform gauze was then packed into the wound.  A bulky dressing was then applied and the tourniquet released.  The patient taken to recovery room having tolerated the procedure quite nicely.  Tourniquet time was approximately 18 minutes.      Camille Bal, MD      [id number]  D:  04/08/2019 14:20  T:  04/08/2019 23:33  JOB #:  [id number]

## 2019-04-08 NOTE — Progress Notes (Signed)
Problem: Falls - Risk of  Goal: *Absence of Falls  Description: Document Schmid Fall Risk and appropriate interventions in the flowsheet.  Outcome: Progressing Towards Goal  Note: Fall Risk Interventions:  Mobility Interventions: Communicate number of staff needed for ambulation/transfer         Medication Interventions: Evaluate medications/consider consulting pharmacy         History of Falls Interventions: Consult care management for discharge planning         Problem: Patient Education: Go to Patient Education Activity  Goal: Patient/Family Education  Outcome: Progressing Towards Goal     Problem: General Medical Care Plan  Goal: *Vital signs within specified parameters  Outcome: Progressing Towards Goal  Goal: *Labs within defined limits  Outcome: Progressing Towards Goal  Goal: *Absence of infection signs and symptoms  Outcome: Progressing Towards Goal  Goal: *Optimal pain control at patient's stated goal  Outcome: Progressing Towards Goal  Goal: *Skin integrity maintained  Outcome: Progressing Towards Goal  Goal: *Fluid volume balance  Outcome: Progressing Towards Goal  Goal: *Optimize nutritional status  Outcome: Progressing Towards Goal  Goal: *Anxiety reduced or absent  Outcome: Progressing Towards Goal  Goal: *Progressive mobility and function (eg: ADL's)  Outcome: Progressing Towards Goal     Problem: Patient Education: Go to Patient Education Activity  Goal: Patient/Family Education  Outcome: Progressing Towards Goal

## 2019-04-08 NOTE — Progress Notes (Signed)
Problem: Falls - Risk of  Goal: *Absence of Falls  Description: Document Schmid Fall Risk and appropriate interventions in the flowsheet.  Outcome: Progressing Towards Goal  Note: Fall Risk Interventions:  Mobility Interventions: Communicate number of staff needed for ambulation/transfer         Medication Interventions: Teach patient to arise slowly         History of Falls Interventions: Bed/chair exit alarm         Problem: Patient Education: Go to Patient Education Activity  Goal: Patient/Family Education  Outcome: Progressing Towards Goal     Problem: General Medical Care Plan  Goal: *Vital signs within specified parameters  Outcome: Progressing Towards Goal  Goal: *Labs within defined limits  Outcome: Progressing Towards Goal  Goal: *Absence of infection signs and symptoms  Outcome: Progressing Towards Goal  Goal: *Optimal pain control at patient's stated goal  Outcome: Progressing Towards Goal  Goal: *Skin integrity maintained  Outcome: Progressing Towards Goal  Goal: *Fluid volume balance  Outcome: Progressing Towards Goal  Goal: *Optimize nutritional status  Outcome: Progressing Towards Goal  Goal: *Anxiety reduced or absent  Outcome: Progressing Towards Goal  Goal: *Progressive mobility and function (eg: ADL's)  Outcome: Progressing Towards Goal     Problem: Patient Education: Go to Patient Education Activity  Goal: Patient/Family Education  Outcome: Progressing Towards Goal
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
## 2019-04-08 NOTE — Progress Notes (Signed)
Problem: Falls - Risk of  Goal: *Absence of Falls  Description: Document Jeremy Jackson Fall Risk and appropriate interventions in the flowsheet.  Outcome: Progressing Towards Goal  Note: Fall Risk Interventions:  Mobility Interventions: Communicate number of staff needed for ambulation/transfer         Medication Interventions: Evaluate medications/consider consulting pharmacy         History of Falls Interventions: Consult care management for discharge planning         Problem: Patient Education: Go to Patient Education Activity  Goal: Patient/Family Education  Outcome: Progressing Towards Goal     Problem: General Medical Care Plan  Goal: *Vital signs within specified parameters  Outcome: Progressing Towards Goal  Goal: *Labs within defined limits  Outcome: Progressing Towards Goal  Goal: *Absence of infection signs and symptoms  Outcome: Progressing Towards Goal  Goal: *Optimal pain control at patient's stated goal  Outcome: Progressing Towards Goal  Goal: *Skin integrity maintained  Outcome: Progressing Towards Goal  Goal: *Fluid volume balance  Outcome: Progressing Towards Goal  Goal: *Optimize nutritional status  Outcome: Progressing Towards Goal  Goal: *Anxiety reduced or absent  Outcome: Progressing Towards Goal  Goal: *Progressive mobility and function (eg: ADL's)  Outcome: Progressing Towards Goal     Problem: Patient Education: Go to Patient Education Activity  Goal: Patient/Family Education  Outcome: Progressing Towards Goal

## 2019-04-08 NOTE — Progress Notes (Signed)
 Pharmacokinetic Consult to Pharmacist    Jeremy Jackson is a 43 y.o. male being treated for SSTI with vancomycin.    Height: 5' 9 (175.3 cm)  Weight: 74.8 kg (165 lb)  Lab Results   Component Value Date/Time    BUN 12 04/08/2019 04:46 AM    Creatinine 0.91 04/08/2019 04:46 AM    WBC 16.6 (H) 04/08/2019 04:46 AM    Lactic acid 1.4 04/06/2019 04:20 PM      Estimated Creatinine Clearance: 105.7 mL/min (based on SCr of 0.91 mg/dL).      Lab Results   Component Value Date/Time    Vancomycin,trough 6.5 04/08/2019 04:46 AM       Day 3 of vancomycin.  Goal trough is 10-20.  Will initiate increase the dose to 1000 mg every 8 hours.  Will continue to follow patient.      Thank you,  Bernardino Nelson, PharmD, BCPS  Clinical Pharmacy Specialist  [phone]
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
## 2019-04-08 NOTE — Progress Notes (Signed)
Problem: Falls - Risk of  Goal: *Absence of Falls  Description: Document Jeremy Jackson Fall Risk and appropriate interventions in the flowsheet.  Outcome: Progressing Towards Goal  Note: Fall Risk Interventions:  Mobility Interventions: Communicate number of staff needed for ambulation/transfer         Medication Interventions: Teach patient to arise slowly         History of Falls Interventions: Bed/chair exit alarm         Problem: Patient Education: Go to Patient Education Activity  Goal: Patient/Family Education  Outcome: Progressing Towards Goal     Problem: General Medical Care Plan  Goal: *Vital signs within specified parameters  Outcome: Progressing Towards Goal  Goal: *Labs within defined limits  Outcome: Progressing Towards Goal  Goal: *Absence of infection signs and symptoms  Outcome: Progressing Towards Goal  Goal: *Optimal pain control at patient's stated goal  Outcome: Progressing Towards Goal  Goal: *Skin integrity maintained  Outcome: Progressing Towards Goal  Goal: *Fluid volume balance  Outcome: Progressing Towards Goal  Goal: *Optimize nutritional status  Outcome: Progressing Towards Goal  Goal: *Anxiety reduced or absent  Outcome: Progressing Towards Goal  Goal: *Progressive mobility and function (eg: ADL's)  Outcome: Progressing Towards Goal     Problem: Patient Education: Go to Patient Education Activity  Goal: Patient/Family Education  Outcome: Progressing Towards Goal

## 2019-04-09 LAB — CBC WITH AUTOMATED DIFF
ABS. BASOPHILS: 0.1 10*3/uL (ref 0.0–0.2)
ABS. EOSINOPHILS: 0.4 10*3/uL (ref 0.0–0.8)
ABS. IMM. GRANS.: 0.1 10*3/uL (ref 0.0–0.5)
ABS. LYMPHOCYTES: 3.1 10*3/uL (ref 0.5–4.6)
ABS. MONOCYTES: 0.8 10*3/uL (ref 0.1–1.3)
ABS. NEUTROPHILS: 4.4 10*3/uL (ref 1.7–8.2)
ABSOLUTE NRBC: 0 10*3/uL (ref 0.0–0.2)
BASOPHILS: 1 % (ref 0.0–2.0)
EOSINOPHILS: 4 % (ref 0.5–7.8)
HCT: 34.4 % — ABNORMAL LOW (ref 41.1–50.3)
HGB: 12.2 g/dL — ABNORMAL LOW (ref 13.6–17.2)
IMMATURE GRANULOCYTES: 1 % (ref 0.0–5.0)
LYMPHOCYTES: 35 % (ref 13–44)
MCH: 30 PG (ref 26.1–32.9)
MCHC: 35.5 g/dL — ABNORMAL HIGH (ref 31.4–35.0)
MCV: 84.7 FL (ref 79.6–97.8)
MONOCYTES: 9 % (ref 4.0–12.0)
MPV: 8.7 FL — ABNORMAL LOW (ref 9.4–12.3)
NEUTROPHILS: 50 % (ref 43–78)
PLATELET: 329 10*3/uL (ref 150–450)
RBC: 4.06 M/uL — ABNORMAL LOW (ref 4.23–5.6)
RDW: 12.6 % (ref 11.9–14.6)
WBC: 8.9 10*3/uL (ref 4.3–11.1)

## 2019-04-09 LAB — METABOLIC PANEL, BASIC
Anion gap: 5 mmol/L — ABNORMAL LOW (ref 7–16)
BUN: 10 MG/DL (ref 6–23)
CO2: 30 mmol/L (ref 21–32)
Calcium: 8.1 MG/DL — ABNORMAL LOW (ref 8.3–10.4)
Chloride: 106 mmol/L (ref 98–107)
Creatinine: 0.8 MG/DL (ref 0.8–1.5)
GFR est AA: >60 mL/min/{1.73_m2} (ref >60)
GFR est non-AA: >60 mL/min/{1.73_m2} (ref >60)
Glucose: 99 mg/dL (ref 65–100)
Potassium: 3.8 mmol/L (ref 3.5–5.1)
Sodium: 141 mmol/L (ref 136–145)

## 2019-04-09 LAB — CULTURE, WOUND W GRAM STAIN

## 2019-04-09 LAB — VANCOMYCIN, TROUGH: Vancomycin,trough: 15.2 ug/mL (ref 5–20)

## 2019-04-09 LAB — BASIC METABOLIC PANEL
Anion Gap: 5 mmol/L — ABNORMAL LOW (ref 7–16)
BUN: 10 MG/DL (ref 6–23)
CO2: 30 mmol/L (ref 21–32)
Calcium: 8.1 MG/DL — ABNORMAL LOW (ref 8.3–10.4)
Chloride: 106 mmol/L (ref 98–107)
Creatinine: 0.8 MG/DL (ref 0.8–1.5)
EGFR IF NonAfrican American: >60 mL/min/{1.73_m2} (ref >60)
GFR African American: >60 mL/min/{1.73_m2} (ref >60)
Glucose: 99 mg/dL (ref 65–100)
Potassium: 3.8 mmol/L (ref 3.5–5.1)
Sodium: 141 mmol/L (ref 136–145)

## 2019-04-09 LAB — VANCOMYCIN TROUGH: Vancomycin Tr: 15.2 ug/mL (ref 5–20)

## 2019-04-09 LAB — CBC WITH AUTO DIFFERENTIAL
Basophils %: 1 % (ref 0.0–2.0)
Basophils Absolute: 0.1 10*3/uL (ref 0.0–0.2)
Eosinophils %: 4 % (ref 0.5–7.8)
Eosinophils Absolute: 0.4 10*3/uL (ref 0.0–0.8)
Granulocyte Absolute Count: 0.1 10*3/uL (ref 0.0–0.5)
Hematocrit: 34.4 % — ABNORMAL LOW (ref 41.1–50.3)
Hemoglobin: 12.2 g/dL — ABNORMAL LOW (ref 13.6–17.2)
Immature Granulocytes: 1 % (ref 0.0–5.0)
Lymphocytes %: 35 % (ref 13–44)
Lymphocytes Absolute: 3.1 10*3/uL (ref 0.5–4.6)
MCH: 30 PG (ref 26.1–32.9)
MCHC: 35.5 g/dL — ABNORMAL HIGH (ref 31.4–35.0)
MCV: 84.7 FL (ref 79.6–97.8)
MPV: 8.7 FL — ABNORMAL LOW (ref 9.4–12.3)
Monocytes %: 9 % (ref 4.0–12.0)
Monocytes Absolute: 0.8 10*3/uL (ref 0.1–1.3)
NRBC Absolute: 0 10*3/uL (ref 0.0–0.2)
Neutrophils %: 50 % (ref 43–78)
Neutrophils Absolute: 4.4 10*3/uL (ref 1.7–8.2)
Platelets: 329 10*3/uL (ref 150–450)
RBC: 4.06 M/uL — ABNORMAL LOW (ref 4.23–5.6)
RDW: 12.6 % (ref 11.9–14.6)
WBC: 8.9 10*3/uL (ref 4.3–11.1)

## 2019-04-09 MED ORDER — TRIMETHOPRIM-SULFAMETHOXAZOLE 160 MG-800 MG TAB
160-800 mg | ORAL_TABLET | Freq: Two times a day (BID) | ORAL | 0 refills | Status: AC
Start: 2019-04-09 — End: 2019-04-23

## 2019-04-09 MED ORDER — PHARMACY VANCOMYCIN NOTE
Freq: Once | Status: AC
Start: 2019-04-09 — End: 2019-04-09
  Administered 2019-04-09: 15:00:00

## 2019-04-09 MED ORDER — DOXYCYCLINE 100 MG TAB
100 mg | ORAL_TABLET | Freq: Two times a day (BID) | ORAL | 0 refills | Status: DC
Start: 2019-04-09 — End: 2019-04-09

## 2019-04-09 MED ORDER — OXYCODONE 5 MG TAB
5 mg | ORAL_TABLET | ORAL | 0 refills | Status: AC | PRN
Start: 2019-04-09 — End: 2019-04-12

## 2019-04-09 MED FILL — VANCOMYCIN 1,000 MG IV SOLR: 1000 mg | INTRAVENOUS | Qty: 1000

## 2019-04-09 MED FILL — ALCOHOL 62% (NOZIN) NASAL SANITIZER: 62 % | CUTANEOUS | Qty: 1

## 2019-04-09 MED FILL — HYDROCODONE-ACETAMINOPHEN 5 MG-325 MG TAB: 5-325 mg | ORAL | Qty: 1

## 2019-04-09 MED FILL — TRAZODONE 50 MG TAB: 50 mg | ORAL | Qty: 2

## 2019-04-09 MED FILL — PHARMACY VANCOMYCIN NOTE: Qty: 1

## 2019-04-09 MED FILL — HYDROMORPHONE (PF) 1 MG/ML IJ SOLN: 1 mg/mL | INTRAMUSCULAR | Qty: 1

## 2019-04-09 MED FILL — PIPERACILLIN-TAZOBACTAM 3.375 GRAM IV SOLR: 3.375 gram | INTRAVENOUS | Qty: 3.38

## 2019-04-09 MED FILL — ALPRAZOLAM 0.5 MG TAB: 0.5 mg | ORAL | Qty: 1

## 2019-04-09 MED FILL — DIPHENHYDRAMINE 25 MG CAP: 25 mg | ORAL | Qty: 1

## 2019-04-09 MED FILL — NICOTINE 21 MG/24 HR DAILY PATCH: 21 mg/24 hr | TRANSDERMAL | Qty: 1

## 2019-04-09 NOTE — Progress Notes (Signed)
Chaplain's follow-up visit attempted. Mr. Staunton was receiving care from staff. Chaplain follow-up is planned as patient is available.     Adrian Duckett, MDiv  Board Certified Chaplain
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
## 2019-04-09 NOTE — Progress Notes (Signed)
Placed on Bactrim, was admitted and now discharged on the Bactrim.
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
## 2019-04-09 NOTE — Progress Notes (Signed)
Pharmacokinetic Consult to Pharmacist    Jeremy Jackson is a 42 y.o. male being treated for SSTI with vancomycin.    Height: 5' 9" (175.3 cm)  Weight: 74.8 kg (165 lb)  Lab Results   Component Value Date/Time    BUN 10 04/09/2019 05:07 AM    Creatinine 0.80 04/09/2019 05:07 AM    WBC 8.9 04/09/2019 05:07 AM    Lactic acid 1.4 04/06/2019 04:20 PM      Estimated Creatinine Clearance: 120.3 mL/min (based on SCr of 0.8 mg/dL).      Lab Results   Component Value Date/Time    Vancomycin,trough 15.2 04/09/2019 10:54 AM       Day 4 of vancomycin.  Goal trough is 15-20.      Vancomycin trough resulted within goal at 15.2, so would continue current dose for now.    Will continue to follow patient.      Thank you,  Matthew Timmons, PharmD, BCPS  Clinical Pharmacist  [phone]

## 2019-04-09 NOTE — Addendum Note (Signed)
Addendum  created 04/09/19 0738 by Jerelyn Charles, CRNA    Intraprocedure Flowsheets edited

## 2019-04-09 NOTE — Other (Signed)
Per lab patient's wound culture results positive for MRSA.  Informed MD Crenshaw.
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
## 2019-04-09 NOTE — Progress Notes (Signed)
Chart screened by case manager for discharge planning.  No needs identified at this time.  Please consult case manager if any new issues arise.    CM will remain available for dc needs.    Care Management Interventions  PCP Verified by CM: Yes  Current Support Network: Lives with Spouse, Own Home  Confirm Follow Up Transport: Family  Discharge Location  Discharge Placement: Home
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
## 2019-04-09 NOTE — Progress Notes (Signed)
 Pharmacokinetic Consult to Pharmacist    Jeremy Jackson is a 43 y.o. male being treated for SSTI with vancomycin.    Height: 5' 9 (175.3 cm)  Weight: 74.8 kg (165 lb)  Lab Results   Component Value Date/Time    BUN 10 04/09/2019 05:07 AM    Creatinine 0.80 04/09/2019 05:07 AM    WBC 8.9 04/09/2019 05:07 AM    Lactic acid 1.4 04/06/2019 04:20 PM      Estimated Creatinine Clearance: 120.3 mL/min (based on SCr of 0.8 mg/dL).      Lab Results   Component Value Date/Time    Vancomycin,trough 15.2 04/09/2019 10:54 AM       Day 4 of vancomycin.  Goal trough is 15-20.      Vancomycin trough resulted within goal at 15.2, so would continue current dose for now.    Will continue to follow patient.      Thank you,  Donnice Rafter, PharmD, BCPS  Clinical Pharmacist  [phone]
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
## 2019-04-09 NOTE — Progress Notes (Signed)
Placed on Bactrim, was admitted and now discharged on the Bactrim.

## 2019-04-09 NOTE — Addendum Note (Signed)
Addendum         created 04/09/19 0738 by Jerelyn Charles, CRNA                   Intraprocedure Flowsheets edited

## 2019-04-09 NOTE — Progress Notes (Signed)
Chaplain's follow-up visit attempted. Jeremy Jackson was receiving care from staff. Chaplain follow-up is planned as patient is available.     Gates Rigg, MDiv  Board Certified Chaplain

## 2019-04-09 NOTE — Progress Notes (Signed)
Chart screened by case manager for discharge planning.  No needs identified at this time.  Please consult case manager if any new issues arise.    CM will remain available for dc needs.    Care Management Interventions  PCP Verified by CM: Yes  Current Support Network: Lives with Spouse, Own Home  Confirm Follow Up Transport: Family  Discharge Location  Discharge Placement: Home

## 2019-04-11 LAB — CULTURE, BLOOD
Culture result:: NO GROWTH
Culture result:: NO GROWTH

## 2019-04-11 LAB — CULTURE, BLOOD 1
Culture: NO GROWTH
Culture: NO GROWTH

## 2019-04-16 LAB — CULTURE, ANAEROBIC

## 2019-12-01 ENCOUNTER — Inpatient Hospital Stay
Admit: 2019-12-01 | Discharge: 2019-12-01 | Disposition: A | Discharge status: Home / Self Care | Payer: PRIVATE HEALTH INSURANCE | Attending: Emergency Medicine

## 2019-12-01 ENCOUNTER — Emergency Department: Admit: 2019-12-01 | Payer: PRIVATE HEALTH INSURANCE

## 2019-12-01 DIAGNOSIS — S0990XA Unspecified injury of head, initial encounter: Secondary | ICD-10-CM
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
## 2019-12-01 NOTE — ED Notes (Signed)
Unable to locate pt after CT scan. Pt not found in hallways bathrooms or common areas.
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
## 2019-12-01 NOTE — ED Notes (Signed)
Unable to locate pt after CT scan. Pt not found in hallways bathrooms or common areas.
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
## 2021-01-31 DIAGNOSIS — F32A Depression, unspecified: Secondary | ICD-10-CM
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
## 2021-01-31 NOTE — ED Notes (Signed)
"  I heard you have to come through here to get set up with like mental health stuff."  Pt. Clarifies that since this past summer when he lost insurance he has been unable to obtain the medications he was taking for PTSD, ADHD and manic depression.  He denies SI, HI and visual or auditory hallucinations at triage.  No physical pain at this time.

## 2021-02-01 ENCOUNTER — Inpatient Hospital Stay
Admit: 2021-02-01 | Discharge: 2021-02-01 | Disposition: A | Discharge status: Home / Self Care | Attending: Emergency Medicine

## 2021-02-01 ENCOUNTER — Inpatient Hospital Stay
Admit: 2021-02-01 | Discharge: 2021-02-01 | Disposition: A | Discharge status: Home / Self Care | Payer: PRIVATE HEALTH INSURANCE

## 2021-02-01 NOTE — ED Notes (Signed)
 I was told to come back again.  Pt. Enters triage room with a family friend.  The friend (patient's sister's boyfriend) asks the patient to tell triage RN about his thoughts of suicide.  Pt. Denies SI at this time, states that he last had thoughts like this weeks ago but that he feels safe now.  Denies SI, HI and hallucinations.  This RN encourages patient to have VS taken, labs drawn and urine sample collected but he states that he prefers to return on Monday as planned during his previous discharge tonight.  This RN emphasizes that at any time the patient may come to the emergency room for help, specifically if he were to feel unsafe, have thoughts of self harm or want to continue the evaluation process.  Patient does not wish to have a further work up now and leaves with his family at this time.

## 2021-02-01 NOTE — Progress Notes (Signed)
 Progress Notes by Rudean Doffing at 02/01/21 9752                Author: Rudean Doffing  Service: --  Author Type: Care Management       Filed: 02/01/21 1053  Date of Service: 02/01/21 0247  Status: Signed          Editor: Rudean Doffing (Care Management)                                                  [place]                                                                                                    PATIENT INFORMATION      Patient Name:  Jeremy Jackson, Jeremy Jackson Acct:  [id number]  Patient MRN:  [id number]      Address:  [address] [place] [facility] [address]  Patient CSN:  [id number]          Religion:  NON DENOMINATIONAL      Sex:  Male  Marital Status:  LEGALLY SEPARATED      DOB:  1976-08-18  Age:    46 yrs      Home Phone:  [phone] 1.00  Mobile Phone:    [phone]           Race:  WHITE/NON-HISPANIC  Employer:    Not Employed      Language:  ENGLISH  Admitted/Arrived From:          ADMISSION INFORMATION      Admit Date:  02/01/2021  Admit Time:  N/A      Patient Class:  Emergency  Service:  Emergency      Admit Source:    Admit Type:  EMERGENCY      Admitting Provider:    Attending Provider:        Unit:  [facility]  Room/Bed:  Room/bed info not found      Admission Diagnosis:   and codes:         Emergency Complaint:  Pysch Eval                    Discharge Date:  02/01/2021  Discharge Time:   2:47 AM      GUARANTOR INFORMATION      Name:   Jeremy Jackson, Jeremy Jackson  Address:  [address]   Rel:   Self      Phone:  [phone]    [place], [place] [address]  DOB:  July 07, 1976      EMERGENCY CONTACTS      Name:  None, Per Pt  Home:   Mobile:  [phone]     Rel:  Other Non-Relative      COVERAGE INFORMATION  Primary Insurance:    [employer]  Subscriber:  Jeremy Jackson, Jeremy Jackson      Plan Name:  Sc [employer] Oap  Pt Rel to Subscriber:  Spouse [02]      Claim Address:  [address]   [place], [place] [address]  Sex:  Male          Policy  #:   [id number]      Group #:  [id number]  Group Name:          Auth #:  N/A  Ins Phone:              Secondary Insurance:    Subscriber:        Plan Name:    Pt Rel to Subscriber:        Claim Address:  NA  Sex:            Policy #:  N/A      Group #:  N/A  Group Name:  N/A      Auth #:  N/A  Ins Phone:              Accident Date:     Accident Type:        PROVIDER INFORMATION      PCP:          None  PCP Phone:   None      Referring Prov:    No ref. provider found  Referring Phone:  Referring Fax:   N/A         Advanced Directive:   Not Received  Research:        Lab Client:    Enrollment Status:                         Printed on 02/01/21 10:52 AM  Page            CM consult left to refer patient to [facility]. Referral sent.

## 2021-02-03 ENCOUNTER — Inpatient Hospital Stay
Admit: 2021-02-03 | Discharge: 2021-02-04 | Disposition: A | Discharge status: Home / Self Care | Attending: Emergency Medicine

## 2021-02-03 DIAGNOSIS — Z760 Encounter for issue of repeat prescription: Secondary | ICD-10-CM
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
## 2021-02-03 NOTE — ED Notes (Signed)
Patient ambulatory to triage with mask in place. States he has been out of his mental health medications and needs to see a psychiatrist. States he has called [facility] and is waiting for an appointment. Hx of PTSD and depression. Denies any suicidal or homicidal thoughts.

## 2021-02-03 NOTE — ED Notes (Signed)
Pt refused to sign the discharge or let his RN get vital signs. Pt upset because he did not get a rx for xanax

## 2021-02-04 MED ORDER — BUPROPION 100 MG TAB
100 mg | ORAL_TABLET | Freq: Two times a day (BID) | ORAL | 0 refills | Status: DC
Start: 2021-02-04 — End: 2021-05-05

## 2021-02-04 MED ORDER — HYDROXYZINE 25 MG TAB
25 mg | ORAL_TABLET | Freq: Four times a day (QID) | ORAL | 0 refills | Status: AC | PRN
Start: 2021-02-04 — End: 2021-02-13

## 2021-05-04 DIAGNOSIS — R45851 Suicidal ideations: Secondary | ICD-10-CM

## 2021-05-04 NOTE — ED Notes (Signed)
Constant Observer No   Constant Observer Oriented N/A   High risk patients are in line of sight at all times No - Patient not high risk    Excess equipment/medical supplies not necessary for the care of the patient removed No - N/A   All sharp or dangerous objects are removed from room: including but not limited to belts, pens & pencils, needles, medications, cosmetics, lighters, matches, nail files, watches, necklaces, glass objects, razors, razor blades, knives, aerosol sprays, drawstring pants, shoes, cords (telephone, call bells, etc.) cleaning wipes or other cleaning items, aluminum cans, not permanently attached wall dcor No - Patient not high risk   Telephone/cell phone removed as well as TV remote (batteries can be swallowed) No - Patient not High risk   Patient belongings removed and labeled at nurses station No -  Patient not High risk   Excess linen is removed from room No -  Patient not High risk   All plastic bags are removed from the room and replaced with paper trash bags No -  Patient not High risk   Patient is in paper scrubs or appropriate gown and using hospital socks with rubber soles No -  Patient not High risk   No metal, hard eating utensils or hard plates are on meal tray No -  Patient not High risk   Remove all cleaning agents used by [employer] No -  None in room   If Crucifix is hanging on a nail, remove Crucifix as well as the nail No - No crucifix in room       *If any question above is answered "No," documentation is required.

## 2021-05-04 NOTE — ED Notes (Deleted)
Patient is a 45 year old male who presents with suicidal ideation for a few days.  No specific plan.    Regular rate and rhythm.  Lungs clear to auscultate.    Patient evaluated initially in triage.  Rapid Medical Evaluation was conducted and necessary orders have been placed.  I have performed a medical screening exam.  Care will now be transferred to the provider in the back of the emergency department.  Carlynn Purl, PA 8:28 PM
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
## 2021-05-04 NOTE — ED Notes (Signed)
Pt presents to the ED from home with c/o SI ideations over the last few days with no specific plans to harm himself. Pt denies having any attempts in the past. Pt denies any other complaints and no auditory or visual hallucinations.

## 2021-05-05 ENCOUNTER — Inpatient Hospital Stay
Admit: 2021-05-05 | Discharge: 2021-05-05 | Disposition: A | Discharge status: Home / Self Care | Attending: Emergency Medicine

## 2021-05-05 LAB — CBC WITH AUTOMATED DIFF
ABS. BASOPHILS: 0.1 10*3/uL (ref 0.0–0.2)
ABS. EOSINOPHILS: 0.2 10*3/uL (ref 0.0–0.8)
ABS. IMM. GRANS.: 0 10*3/uL (ref 0.0–0.5)
ABS. LYMPHOCYTES: 2.6 10*3/uL (ref 0.5–4.6)
ABS. MONOCYTES: 0.6 10*3/uL (ref 0.1–1.3)
ABS. NEUTROPHILS: 2.9 10*3/uL (ref 1.7–8.2)
ABSOLUTE NRBC: 0 10*3/uL (ref 0.0–0.2)
BASOPHILS: 1 % (ref 0.0–2.0)
EOSINOPHILS: 4 % (ref 0.5–7.8)
HCT: 38.8 % — ABNORMAL LOW (ref 41.1–50.3)
HGB: 13.8 g/dL (ref 13.6–17.2)
IMMATURE GRANULOCYTES: 0 % (ref 0.0–5.0)
LYMPHOCYTES: 41 % (ref 13–44)
MCH: 29.4 PG (ref 26.1–32.9)
MCHC: 35.6 g/dL — ABNORMAL HIGH (ref 31.4–35.0)
MCV: 82.7 FL (ref 79.6–97.8)
MONOCYTES: 9 % (ref 4.0–12.0)
MPV: 10 FL (ref 9.4–12.3)
NEUTROPHILS: 45 % (ref 43–78)
PLATELET: 277 10*3/uL (ref 150–450)
RBC: 4.69 M/uL (ref 4.23–5.6)
RDW: 13.1 % (ref 11.9–14.6)
WBC: 6.3 10*3/uL (ref 4.3–11.1)

## 2021-05-05 LAB — METABOLIC PANEL, BASIC
Anion gap: 4 mmol/L — ABNORMAL LOW (ref 7–16)
BUN: 14 MG/DL (ref 6–23)
CO2: 27 mmol/L (ref 21–32)
Calcium: 8.7 MG/DL (ref 8.3–10.4)
Chloride: 107 mmol/L (ref 98–107)
Creatinine: 0.8 MG/DL (ref 0.8–1.5)
GFR est AA: >60 mL/min/{1.73_m2} (ref >60)
GFR est non-AA: >60 mL/min/{1.73_m2} (ref >60)
Glucose: 74 mg/dL (ref 65–100)
Potassium: 4.6 mmol/L (ref 3.5–5.1)
Sodium: 138 mmol/L (ref 136–145)

## 2021-05-05 LAB — ETHYL ALCOHOL
ALCOHOL(ETHYL),SERUM: <3 MG/DL
Ethyl Alcohol: <3 MG/DL

## 2021-05-05 LAB — CBC WITH AUTO DIFFERENTIAL
Basophils %: 1 % (ref 0.0–2.0)
Basophils Absolute: 0.1 10*3/uL (ref 0.0–0.2)
Eosinophils %: 4 % (ref 0.5–7.8)
Eosinophils Absolute: 0.2 10*3/uL (ref 0.0–0.8)
Granulocyte Absolute Count: 0 10*3/uL (ref 0.0–0.5)
Hematocrit: 38.8 % — ABNORMAL LOW (ref 41.1–50.3)
Hemoglobin: 13.8 g/dL (ref 13.6–17.2)
Immature Granulocytes: 0 % (ref 0.0–5.0)
Lymphocytes %: 41 % (ref 13–44)
Lymphocytes Absolute: 2.6 10*3/uL (ref 0.5–4.6)
MCH: 29.4 PG (ref 26.1–32.9)
MCHC: 35.6 g/dL — ABNORMAL HIGH (ref 31.4–35.0)
MCV: 82.7 FL (ref 79.6–97.8)
MPV: 10 FL (ref 9.4–12.3)
Monocytes %: 9 % (ref 4.0–12.0)
Monocytes Absolute: 0.6 10*3/uL (ref 0.1–1.3)
NRBC Absolute: 0 10*3/uL (ref 0.0–0.2)
Neutrophils %: 45 % (ref 43–78)
Neutrophils Absolute: 2.9 10*3/uL (ref 1.7–8.2)
Platelets: 277 10*3/uL (ref 150–450)
RBC: 4.69 M/uL (ref 4.23–5.6)
RDW: 13.1 % (ref 11.9–14.6)
WBC: 6.3 10*3/uL (ref 4.3–11.1)

## 2021-05-05 LAB — BASIC METABOLIC PANEL
Anion Gap: 4 mmol/L — ABNORMAL LOW (ref 7–16)
BUN: 14 MG/DL (ref 6–23)
CO2: 27 mmol/L (ref 21–32)
Calcium: 8.7 MG/DL (ref 8.3–10.4)
Chloride: 107 mmol/L (ref 98–107)
Creatinine: 0.8 MG/DL (ref 0.8–1.5)
EGFR IF NonAfrican American: >60 mL/min/{1.73_m2} (ref >60)
GFR African American: >60 mL/min/{1.73_m2} (ref >60)
Glucose: 74 mg/dL (ref 65–100)
Potassium: 4.6 mmol/L (ref 3.5–5.1)
Sodium: 138 mmol/L (ref 136–145)

## 2021-05-05 MED ORDER — BUPROPION XL 150 MG 24 HR TAB
150 mg | ORAL_TABLET | Freq: Every day | ORAL | 1 refills | Status: AC
Start: 2021-05-05 — End: ?

## 2021-05-05 NOTE — ED Notes (Signed)
Patient is sleeping quietly with no signs of distress, breathing is even and unlabored

## 2021-05-05 NOTE — Progress Notes (Signed)
Jeremy Jackson with [facility] spoke with pt and has assisted with getting pt accepted to [facility] house, pt has contacted [name] and completed intake information, CM has also spoken with [name] and [name] and confirmed pt is to be dc today and go straight to facility. MD and primary RN [name] notified, Round trip arranged for transport at 115 pm, pt also updated on pending transport and is agreeable.    Care Management Interventions  PCP Verified by CM: Yes (no MD, pt aware of [facility] and [facility])  MyChart Signup: No  Discharge Durable Medical Equipment: No  Physical Therapy Consult: No  Occupational Therapy Consult: No  Speech Therapy Consult: No  Confirm Follow Up Transport: [facility]  Discharge Location  Patient Expects to be Discharged to:: Other:

## 2021-05-05 NOTE — ED Notes (Signed)
Breakfast tray provided. 

## 2021-05-05 NOTE — ED Notes (Signed)
Patient is watching tv with no signs of distress breathing is even and unlabored

## 2021-05-05 NOTE — Progress Notes (Signed)
 [facility]                                                                                                 PATIENT INFORMATION   Patient Name: [name], Jeremy Jackson Acct: [id number] Patient MRN: [id number]   Address: [address] [place] [address] Patient CSN: [id number]     Religion: NON DENOMINATIONAL   Sex: Male Marital Status: LEGALLY SEPARATED   DOB: October 02, 1976 Age:   45 yrs   Home Phone: [phone]  Mobile Phone:   [phone]        Race: WHITE/NON-HISPANIC Employer:   Not Employed   Language: ENGLISH Admitted/Arrived From:      ADMISSION INFORMATION   Admit Date: 05/04/2021 Admit Time:  8:45 PM   Patient Class: Emergency Service: Emergency   Admit Source: Non-health care facility* Admit Type: EMERGENCY   Admitting Provider:  Attending Provider:    Unit: [facility] Room/Bed: ER11/11   Admission Diagnosis:  and codes:      Emergency Complaint: Si;;;Prisma ems                Discharge Date:  Discharge Time:    GUARANTOR INFORMATION   Name:  Abdiel, [name] Address: [address]  Rel:  Self   Phone: [phone]  [place], [place] [address] DOB: [birth year]   EMERGENCY CONTACTS   Name: None, Per Pt Home:  Mobile: [phone]   Rel: Other Non-Relative   COVERAGE INFORMATION   Primary Insurance:   N/A Subscriber:    Plan Name:  Pt Rel to Subscriber:    Claim Address: NA Sex:      Policy #:  N/A   Group #: N/A Group Name:      Auth #: N/A Ins Phone:       Secondary Insurance:  Subscriber:    Plan Name:  Pt Rel to Subscriber:    Claim Address: NA Sex:      Policy #: N/A   Group #: N/A Group Name: N/A   Auth #: N/A Ins Phone:       Accident Date:   Accident Type:    PROVIDER INFORMATION   PCP:         None PCP Phone:  None   Referring Prov:   No ref. provider found Referring Phone:  Referring Fax:  N/A     Advanced Directive:  Not Received Research:    Lab Client:  Enrollment Status:            Printed on 05/05/21  9:08 AM Page      Pt is pending psych evaluation to be completed, pt is uninsured, CM will follow to assist with dc needs.

## 2021-05-05 NOTE — ED Notes (Signed)
Report given to Emily, RN. Care transferred at this time.

## 2021-05-05 NOTE — ED Notes (Signed)
 Constant Observer Yes - Name: ananastaycia   Constant Observer Oriented YES   High risk patients are in line of sight at all times Yes   Excess equipment/medical supplies not necessary for the care of the patient removed Yes   All sharp or dangerous objects are removed from room: including but not limited to belts, pens & pencils, needles, medications, cosmetics, lighters, matches, nail files, watches, necklaces, glass objects, razors, razor blades, knives, aerosol sprays, drawstring pants, shoes, cords (telephone, call bells, etc.) cleaning wipes or other cleaning items, aluminum cans, not permanently attached wall dcor Yes   Telephone/cell phone removed as well as TV remote (batteries can be swallowed) Yes   Patient belongings removed and labeled at nurses station Yes   Excess linen is removed from room Yes   All plastic bags are removed from the room and replaced with paper trash bags Yes   Patient is in paper scrubs or appropriate gown and using hospital socks with rubber soles Yes   No metal, hard eating utensils or hard plates are on meal tray Yes   Remove all cleaning agents used by [employer] Yes   If Crucifix is hanging on a nail, remove Crucifix as well as the nail

## 2021-05-05 NOTE — ED Notes (Signed)
Patient on the phone with favor
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
## 2021-05-05 NOTE — ED Notes (Signed)
Stacy in with patient.

## 2021-05-05 NOTE — ED Notes (Signed)
Report given to Joe RN. Care transferred at this time.

## 2021-05-05 NOTE — ED Notes (Signed)
Patient provided lunch tray.

## 2021-05-05 NOTE — ED Notes (Signed)
I have reviewed discharge instructions with the patient.  The patient verbalized understanding.    Patient left ED via Discharge Method: ambulatory to [facility] house with roundtrip    Opportunity for questions and clarification provided.       Patient given 1 scripts.         To continue your aftercare when you leave the hospital, you may receive an automated call from our care team to check in on how you are doing.  This is a free service and part of our promise to provide the best care and service to meet your aftercare needs." If you have questions, or wish to unsubscribe from this service please call [phone].  Thank you for Choosing our [facility] Emergency Department.

## 2021-05-05 NOTE — ED Notes (Signed)
Pt given prescription for Wellbutrin by Dr. Verlon Au

## 2021-05-05 NOTE — ED Notes (Signed)
Patient is sitting quietly watching tv with no distress breathing is even and unlabored

## 2021-05-05 NOTE — Consults (Signed)
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 05/04/21  8:34 PM   Result Value Ref Range    Sodium 138 136 - 145 mmol/L    Potassium 4.6 3.5 - 5.1 mmol/L    Chloride 107 98 - 107 mmol/L    CO2 27 21 - 32 mmol/L    Anion gap 4 (L) 7 - 16 mmol/L    Glucose 74 65 - 100 mg/dL    BUN 14 6 - 23 MG/DL    Creatinine 0.80 0.8 - 1.5 MG/DL    GFR est AA >60 >60 ml/min/1.2m    GFR est non-AA >60 >60 ml/min/1.7104m   Calcium 8.7 8.3 - 10.4 MG/DL       Medical Review Of Systems:  Psychological ROS: positive for - sleep disturbances, depression  Psychological ROS: negative for - SI/HI/AVH    Mental Status Evaluation:    General Appearance Appears stated age  General Behavior Calm, cooperative, good eye contact, intermittently tearful  Psychomotor Activity Normoactive - no restlessness or tremors noted at this time  Gait                  Steady without assist  Speech   fluent, normal volume, normal tone, normal rate, normal prosody and normal amount  Mood               depressed  Affect    congruent  Thought Process Coherent, logical  Thought Content/Perceptual Disturbances Denies suicidal/homicidal ideation, auditory/visual hallucinations, paranoia, delusions, grandiosity, increased goal directed activity, preoccupation, obsessions/compulsions and phobias  Cognition                    Alert and oriented to person, place, time and situation  Insight             Fair  Judgment            Fair / poor - r/t drug use    3 most recent PHQ Screens 05/05/2021   Little interest or  pleasure in doing things Not at all   Feeling down, depressed, irritable, or hopeless Several days   Total Score PHQ 2 1   Trouble falling or staying asleep, or sleeping too much Several days   Feeling tired or having little energy Several days   Poor appetite, weight loss, or overeating Not at all   Feeling bad about yourself - or that you are a failure or have let yourself or your family down Not at all   Trouble concentrating on things such as school, work, reading, or watching TV More than half the days   Moving or speaking so slowly that other people could have noticed; or the opposite being so fidgety that others notice Not  at all   Thoughts of being better off dead, or hurting yourself in some way Not at all   PHQ 9 Score 5         ASSESSMENT  Psychiatric Diagnoses:  Polysubstance abuse ([place]) [F19.10 (ICD-10-CM)], Psychosocial stressors [Z65.8 (ICD-10-CM)], Situational depression [F43.21 (ICD-10-CM)]      Recommendations:  -Pt currently does NOT meet criteria for IVC - pt denies any current or active SI/HI/AVH - no intent or plan - pts thought process is clear, coherent, logical - pt asking for assistance getting into halfway house / pt would like to get restarted on antidepressant / pt would like to get established with mental health clinic  -Contacted Joey with [facility] - he will speak with pt via telephone to assist with placement  -Pt needs referral to [facility]  -Can consider restarting Wellbutrin - XL 150 mg po daily - pt has been on this medication before with success and no significant SE - monitor for dizziness, dry mouth, constipation, nausea, anxiety, tremor, abd pain, tinnitus, sweating, rash, hypertension  -Please assist with transportation and medication voucher if needed  -Discussed with MD, CM, RN - all in agreement with plan    UPDATE:  Per CM, pt accepted to [facility] transportation set up        [employer] [place]  05/05/2021  [place]

## 2021-05-05 NOTE — ED Notes (Signed)
Patient initially presented with possibility of suicidal ideation which she has affirmed that was not an issue on multiple questioning.  He is in a living situation which she is not tolerant of at present.  Turning point at 1 point but left.  Denies any self or other harming thoughts or intent.  Is waiting for the psychological screening

## 2021-05-05 NOTE — Progress Notes (Signed)
Referral made to [facility] clinic for follow up.

## 2022-01-05 ENCOUNTER — Other Ambulatory Visit: Payer: Self-pay

## 2022-01-05 ENCOUNTER — Emergency Department (HOSPITAL_COMMUNITY)
Admission: EM | Admit: 2022-01-05 | Discharge: 2022-01-05 | Discharge status: Against Medical Advice | Payer: Self-pay | Source: Home / Self Care

## 2022-01-06 ENCOUNTER — Emergency Department (HOSPITAL_COMMUNITY): Payer: Worker's Compensation

## 2022-01-06 ENCOUNTER — Emergency Department (HOSPITAL_COMMUNITY)
Admission: EM | Admit: 2022-01-06 | Discharge: 2022-01-06 | Disposition: A | Discharge status: Home / Self Care | Payer: Worker's Compensation | Attending: Emergency Medicine | Admitting: Emergency Medicine

## 2022-01-06 ENCOUNTER — Other Ambulatory Visit: Payer: Self-pay

## 2022-01-06 ENCOUNTER — Encounter (HOSPITAL_COMMUNITY): Payer: Self-pay

## 2022-01-06 DIAGNOSIS — S79921A Unspecified injury of right thigh, initial encounter: Secondary | ICD-10-CM | POA: Diagnosis present

## 2022-01-06 DIAGNOSIS — S76219A Strain of adductor muscle, fascia and tendon of unspecified thigh, initial encounter: Secondary | ICD-10-CM

## 2022-01-06 DIAGNOSIS — W19XXXA Unspecified fall, initial encounter: Secondary | ICD-10-CM | POA: Diagnosis not present

## 2022-01-06 DIAGNOSIS — Y990 Civilian activity done for income or pay: Secondary | ICD-10-CM | POA: Diagnosis not present

## 2022-01-06 DIAGNOSIS — S76911A Strain of unspecified muscles, fascia and tendons at thigh level, right thigh, initial encounter: Secondary | ICD-10-CM | POA: Insufficient documentation

## 2022-01-06 DIAGNOSIS — N50819 Testicular pain, unspecified: Secondary | ICD-10-CM

## 2022-01-06 MED ORDER — IBUPROFEN 200 MG PO TABS
400.0000 mg | ORAL_TABLET | Freq: Once | ORAL | Status: AC
  Administered 2022-01-06: 400 mg via ORAL
  Filled 2022-01-06: qty 2

## 2022-01-06 MED ORDER — ACETAMINOPHEN 325 MG PO TABS
650.0000 mg | ORAL_TABLET | Freq: Once | ORAL | Status: AC
  Administered 2022-01-06: 650 mg via ORAL
  Filled 2022-01-06: qty 2

## 2022-01-06 MED ORDER — KETOROLAC TROMETHAMINE 15 MG/ML IJ SOLN
15.0000 mg | Freq: Once | INTRAMUSCULAR | Status: AC
  Administered 2022-01-06: 15 mg via INTRAMUSCULAR
  Filled 2022-01-06: qty 1

## 2022-01-06 NOTE — ED Provider Notes (Signed)
[facility]-EMERGENCY DEPT Provider Note   CSN: [id number] Arrival date & time: 01/06/22  0411     History  Chief Complaint  Patient presents with   Groin Swelling    Jacob Beard is a 46 y.o. male.  The history is provided by the patient.  He fell at work and hit his right knee and suffered an injury to his groin.  He was seen at urgent care and cleared, but since then, has had increased pain in the perineal area.  Pain ranges from 4/10 to 7/10.  There is no radiation of pain.  He denies any nausea or vomiting.  He initially had some difficulty urinating, but has been able to urinate twice since being in the emergency department.  He denies any blood in the urine.  He is concerned about the groin injury and wants an ultrasound to be done.  He had taken ibuprofen and naproxen which gave slight relief.   Home Medications Prior to Admission medications   Not on File      Allergies    Patient has no allergy information on record.    Review of Systems   Review of Systems  All other systems reviewed and are negative.  Physical Exam Updated Vital Signs BP (!) 129/100 (BP Location: Left Arm)    Pulse 96    Temp 98.2 F (36.8 C) (Oral)    Resp 16    Ht 5\' 9"  (1.753 m)    Wt 73.5 kg    SpO2 98%    BMI 23.92 kg/m  Physical Exam Vitals and nursing note reviewed.  46 year old male, resting comfortably and in no acute distress. Vital signs are significant for elevated blood pressure. Oxygen saturation is 98%, which is normal. Head is normocephalic and atraumatic. PERRLA, EOMI. Oropharynx is clear. Neck is nontender and supple without adenopathy or JVD. Back is nontender and there is no CVA tenderness. Lungs are clear without rales, wheezes, or rhonchi. Chest is nontender. Heart has regular rate and rhythm without murmur. Abdomen is soft, flat, nontender. Genitalia: Circumcised penis.  Testes descended without swelling or tenderness.  There is tenderness to  palpation in both inguinal canals without any hernia identified.  There is also tenderness in the perineum without any swelling. Extremities have no cyanosis or edema, full range of motion is present. Skin is warm and dry without rash. Neurologic: Mental status is normal, cranial nerves are intact, moves all extremities equally.  ED Results / Procedures / Treatments   Labs (all labs ordered are listed, but only abnormal results are displayed) Labs Reviewed  URINALYSIS, ROUTINE W REFLEX MICROSCOPIC   Radiology 54 SCROTUM W/DOPPLER  Result Date: 01/06/2022 CLINICAL DATA:  Testicular pain for 1 day EXAM: SCROTAL ULTRASOUND DOPPLER ULTRASOUND OF THE TESTICLES TECHNIQUE: Complete ultrasound examination of the testicles, epididymis, and other scrotal structures was performed. Color and spectral Doppler ultrasound were also utilized to evaluate blood flow to the testicles. COMPARISON:  None. FINDINGS: Right testicle Measurements: 38 x 24 x 29 mm. No mass or microlithiasis visualized. Left testicle Measurements: 35 x 26 x 28 mm. No mass or microlithiasis visualized. Right epididymis:  Normal in size and appearance. Left epididymis:  Normal in size and appearance. Hydrocele:  Trace, simple fluid bilaterally, commonly encountered. Varicocele:  None visualized. Pulsed Doppler interrogation of both testes demonstrates normal low resistance arterial and venous waveforms bilaterally. IMPRESSION: Trace bilateral hydrocele with normal appearance of the bilateral testicle and epididymis. No specific cause for  symptoms. Electronically Signed   By: Tiburcio Pea M.D.   On: 01/06/2022 06:04    Procedures Procedures    Medications Ordered in ED Medications  ibuprofen (ADVIL) tablet 400 mg (has no administration in time range)  acetaminophen (TYLENOL) tablet 650 mg (has no administration in time range)    ED Course/ Medical Decision Making/ A&P                           Medical Decision Making  Groin  injury which appears to be soft tissue, no evidence of significant scrotal injury.  However, will send for ultrasound.  Old records are reviewed, and he has no relevant past visits.  Ultrasound shows trace hydrocele, no evidence of trauma.  Ultrasound is pending.  Case is signed out to Dr. Rodena Medin.        Final Clinical Impression(s) / ED Diagnoses Final diagnoses:  Groin strain, unspecified laterality, initial encounter    Rx / DC Orders ED Discharge Orders     None         Dione Booze, MD 01/06/22 0740

## 2022-01-06 NOTE — ED Provider Notes (Signed)
° °  Seen after prior EDP.  Patient feels moderately improved.   US demonstrates Hydrocele without other acute finding.   Patient understands appropriate outpatient management.   Need for close outpatient follow up stressed.   Strict return precautions given and understood.    Wynetta Fines, MD 01/06/22 [phone]

## 2022-01-06 NOTE — Discharge Instructions (Addendum)
Return for any problem.   Rest and Ice area as instructed.   Take Ibuprofen - 600 mg every 8 hours - for pain.

## 2022-01-06 NOTE — ED Triage Notes (Signed)
Pt states that he had a fall at work yesterday causing his testicles to swell. Pt states that he needs an ultrasound of his testicles, he doesn't want anyone to touch them again.

## 2022-03-03 DIAGNOSIS — F1910 Other psychoactive substance abuse, uncomplicated: Principal | ICD-10-CM

## 2022-03-03 DIAGNOSIS — R45851 Suicidal ideations: Secondary | ICD-10-CM
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
## 2022-03-03 NOTE — ED Triage Notes (Signed)
Pt arrives ambulatory from home.  States that for about 1 year now he has been having paranoid delusions.  States that he cannot take it anymore and is now having SI.  Denies HI

## 2022-03-03 NOTE — ED Notes (Signed)
Patient given urinal for urine sample.      [name], RN  03/03/22 2156

## 2022-03-03 NOTE — ED Notes (Signed)
Tele psych placed into patient room     Jeremy Jackson, [place]  03/03/22 2227

## 2022-03-03 NOTE — ED Notes (Signed)
I did evaluate the patient upon arrival on day shift.  Patient not willing to have repeat potassium drawn.  After discussion, he became more agitated.  I did inquire if patient was still suicidal.  States he could not say that he would not harm himself if he was discharged to the street.  Patient placed on IVC paperwork will see social work upon their arrival to the ER.     Eliott Nine, DO  03/04/22 0725
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
## 2022-03-03 NOTE — Consults (Signed)
Arranged consult with [facility] Tele Psychiatry via web site. Consult ID [id number]

## 2022-03-03 NOTE — ED Notes (Signed)
Patient resting at this time. No signs or symptoms of distress. Respirations even and unlabored. Sitter at bedside. Safety precautions in place.       [name], [place]  03/03/22 [phone]

## 2022-03-03 NOTE — ED Notes (Signed)
Patient moved to room 12. Security at bedside with RN to wand patient at this time.   Patient given paper scrubs to change into. Belongings locked in cabinet in room 12.   Patient accompanied by girlfriend in room.      [name], RN  03/03/22 2155

## 2022-03-04 ENCOUNTER — Inpatient Hospital Stay
Admit: 2022-03-04 | Discharge: 2022-03-07 | Disposition: A | Discharge status: Home / Self Care | Attending: Emergency Medicine

## 2022-03-04 LAB — COMPREHENSIVE METABOLIC PANEL
ALT: 32 U/L (ref 12–65)
AST: 58 U/L — ABNORMAL HIGH (ref 15–37)
Albumin: 3.5 g/dL (ref 3.5–5.0)
Alk Phosphatase: 87 U/L (ref 50–136)
Anion Gap: 16 mmol/L — ABNORMAL HIGH (ref 2–11)
BUN: 13 MG/DL (ref 6–23)
CO2: 11 mmol/L — ABNORMAL LOW (ref 21–32)
Calcium: 8.9 MG/DL (ref 8.3–10.4)
Chloride: 103 mmol/L (ref 101–110)
Creatinine: 0.96 MG/DL (ref 0.8–1.5)
Est, Glom Filt Rate: >60 mL/min/{1.73_m2} (ref >60)
Glucose: 92 mg/dL (ref 65–100)
Potassium: 6.3 mmol/L (ref 3.5–5.1)
Sodium: 130 mmol/L — ABNORMAL LOW (ref 133–143)
Total Bilirubin: 1.4 MG/DL — ABNORMAL HIGH (ref 0.2–1.1)
Total Protein: 7.9 g/dL (ref 6.3–8.2)

## 2022-03-04 LAB — CBC WITH AUTO DIFFERENTIAL
Absolute Eos #: 0.1 10*3/uL (ref 0.0–0.8)
Absolute Immature Granulocyte: 0 10*3/uL (ref 0.0–0.5)
Absolute Lymph #: 2 10*3/uL (ref 0.5–4.6)
Absolute Mono #: 0.6 10*3/uL (ref 0.1–1.3)
Basophils Absolute: 0 10*3/uL (ref 0.0–0.2)
Basophils: 0 % (ref 0.0–2.0)
Eosinophils %: 1 % (ref 0.5–7.8)
Hematocrit: 43.8 % (ref 41.1–50.3)
Hemoglobin: 15.6 g/dL (ref 13.6–17.2)
Immature Granulocytes: 0 % (ref 0.0–5.0)
Lymphocytes: 39 % (ref 13–44)
MCH: 29.7 PG (ref 26.1–32.9)
MCHC: 35.6 g/dL — ABNORMAL HIGH (ref 31.4–35.0)
MCV: 83.4 FL (ref 82–102)
MPV: 9.3 FL — ABNORMAL LOW (ref 9.4–12.3)
Monocytes: 12 % (ref 4.0–12.0)
Platelet Comment: ADEQUATE
Platelets: 267 10*3/uL (ref 150–450)
RBC: 5.25 M/uL (ref 4.23–5.6)
RDW: 12.7 % (ref 11.9–14.6)
Seg Neutrophils: 48 % (ref 43–78)
Segs Absolute: 2.5 10*3/uL (ref 1.7–8.2)
WBC: 5.2 10*3/uL (ref 4.3–11.1)
nRBC: 0 10*3/uL (ref 0.0–0.2)

## 2022-03-04 LAB — URINE DRUG SCREEN
Amphetamine, Urine: POSITIVE — AB
Barbiturates, Urine: NEGATIVE
Benzodiazepines, Urine: NEGATIVE
Cocaine, Urine: NEGATIVE
Methadone, Urine: NEGATIVE
Opiates, Urine: NEGATIVE
PCP, Urine: NEGATIVE
THC, TH-Cannabinol, Urine: POSITIVE — AB

## 2022-03-04 LAB — ACETAMINOPHEN LEVEL: Acetaminophen Level: <2 ug/mL — ABNORMAL LOW (ref 10.0–30.0)

## 2022-03-04 LAB — EKG 12-LEAD
Atrial Rate: 92 {beats}/min
Diagnosis: NORMAL
P Axis: 81 degrees
P-R Interval: 170 ms
Q-T Interval: 348 ms
QRS Duration: 92 ms
QTc Calculation (Bazett): 430 ms
R Axis: 94 degrees
T Axis: 62 degrees
Ventricular Rate: 92 {beats}/min

## 2022-03-04 LAB — SALICYLATE LEVEL: Salicylate, Serum: <1.7 MG/DL — ABNORMAL LOW (ref 2.8–20.0)

## 2022-03-04 LAB — MAGNESIUM: Magnesium: 2.5 mg/dL — ABNORMAL HIGH (ref 1.8–2.4)

## 2022-03-04 LAB — ETHANOL: Ethanol Lvl: <3 MG/DL

## 2022-03-04 LAB — POTASSIUM: Potassium: 4.5 mmol/L (ref 3.5–5.1)

## 2022-03-04 MED ORDER — OLANZAPINE 5 MG PO TBDP
5 MG | Freq: Two times a day (BID) | ORAL | Status: DC
Start: 2022-03-04 — End: 2022-03-07
  Administered 2022-03-04 – 2022-03-07 (×8): 5 mg via ORAL

## 2022-03-04 MED ORDER — ACETAMINOPHEN 325 MG PO TABS
325 MG | ORAL | Status: AC
Start: 2022-03-04 — End: 2022-03-04
  Administered 2022-03-04: 13:00:00 650 mg via ORAL

## 2022-03-04 MED ORDER — HYDROXYZINE HCL 25 MG PO TABS
25 MG | Freq: Four times a day (QID) | ORAL | Status: DC | PRN
Start: 2022-03-04 — End: 2022-03-07
  Administered 2022-03-04 – 2022-03-05 (×2): 50 mg via ORAL

## 2022-03-04 MED FILL — ACETAMINOPHEN 325 MG PO TABS: 325 MG | ORAL | Qty: 2

## 2022-03-04 MED FILL — OLANZAPINE 5 MG PO TBDP: 5 MG | ORAL | Qty: 1

## 2022-03-04 MED FILL — HYDROXYZINE HCL 25 MG PO TABS: 25 MG | ORAL | Qty: 2

## 2022-03-04 NOTE — ED Notes (Signed)
Patient resting at this time. No signs or symptoms of distress. Respirations even and unlabored. Sitter at bedside. Safety precautions in place.       [name], RN  03/04/22 [phone]

## 2022-03-04 NOTE — ED Notes (Signed)
Constant Observer Yes - Name: Sitter   Constant Observer Oriented yes   High risk patients are in line of sight at all times Yes   Excess equipment/medical supplies not necessary for the care of the patient removed Yes   All sharp or dangerous objects are removed from room: including but not limited to belts, pens & pencils, needles, medications, cosmetics, lighters, matches, nail files, watches, necklaces, glass objects, razors, razor blades, knives, aerosol sprays, drawstring pants, shoes, cords (telephone, call bells, etc.) cleaning wipes or other cleaning items, aluminum cans, not permanently attached wall d??cor Yes   Telephone/cell phone removed as well as TV remote (batteries can be swallowed) Yes   Patient belongings removed and labeled at nurses station Yes   Excess linen is removed from room Yes   All plastic bags are removed from the room and replaced with paper trash bags Yes   Patient is in paper scrubs or appropriate gown and using hospital socks with rubber soles Yes   No metal, hard eating utensils or hard plates are on meal tray Yes   Remove all cleaning agents used by [employer] Yes   If Crucifix is hanging on a nail, remove Crucifix as well as the nail Yes       *If any question above is answered "No," documentation is required.        [name], RN  03/04/22 [phone]

## 2022-03-04 NOTE — ED Notes (Signed)
Patient resting at this time. No signs or symptoms of distress. Respirations even and unlabored. Sitter at bedside. Safety precautions in place.     Tor Netters, RN  03/04/22 [phone]

## 2022-03-04 NOTE — ED Notes (Signed)
Lab to obtain re-collects     [name], RN  03/04/22 [phone]

## 2022-03-04 NOTE — ED Notes (Signed)
Patient resting at this time. No signs or symptoms of distress. Respirations even and unlabored. Sitter at bedside. Safety precautions in place.       [name], RN  03/04/22 2229

## 2022-03-04 NOTE — ED Notes (Signed)
Patient resting at this time. No signs or symptoms of distress. Respirations even and unlabored. Sitter at bedside. Safety precautions in place.     [name], RN  03/04/22 2137

## 2022-03-04 NOTE — ED Notes (Signed)
Patient resting at this time. No signs or symptoms of distress. Respirations even and unlabored. Sitter at bedside. Safety precautions in place.     Tor Netters, RN  03/04/22 [phone]

## 2022-03-04 NOTE — ED Notes (Signed)
Patient resting at this time. No signs or symptoms of distress. Respirations even and unlabored. Sitter at bedside. Safety precautions in place.     [name], [place]  03/04/22 [phone]

## 2022-03-04 NOTE — ED Notes (Signed)
Patient resting at this time. No signs or symptoms of distress. Respirations even and unlabored. Sitter at bedside. Safety precautions in place.       [name], RN  03/04/22 0120

## 2022-03-04 NOTE — ED Notes (Signed)
Patient resting at this time. No signs or symptoms of distress. Respirations even and unlabored. Sitter at bedside. Safety precautions in place.       [name], RN  03/04/22 [phone]

## 2022-03-04 NOTE — ED Notes (Signed)
Patient resting at this time. No signs or symptoms of distress. Respirations even and unlabored. Sitter at bedside. Safety precautions in place.       [name], [place]  03/04/22 [phone]

## 2022-03-04 NOTE — ED Notes (Signed)
Report given to [facility]      Carlisle Beers, RN  03/04/22 [phone]

## 2022-03-04 NOTE — ED Notes (Signed)
Patient resting at this time. No signs or symptoms of distress. Respirations even and unlabored. Sitter at bedside. Safety precautions in place.       [name], RN  03/04/22 2136

## 2022-03-04 NOTE — ED Notes (Signed)
Patient resting at this time. No signs or symptoms of distress. Respirations even and unlabored. Sitter at bedside. Safety precautions in place.     Jeremy Jackson, [place]  03/04/22 [phone]

## 2022-03-04 NOTE — ED Notes (Signed)
Patient resting at this time. No signs or symptoms of distress. Respirations even and unlabored. Sitter at bedside. Safety precautions in place.       [name], RN  03/04/22 1911

## 2022-03-04 NOTE — ED Notes (Signed)
Patient resting at this time. No signs or symptoms of distress. Respirations even and unlabored. Sitter at bedside. Safety precautions in place.     Kawanna Christley B Graycie Halley, RN  03/04/22 0711

## 2022-03-04 NOTE — Care Coordination-Inpatient (Signed)
Chart review complete,  CM attempted to meet with pt at bedside, pt wakes easily when name called, very short with answers to questions, vague with answers. Pt is on IVC papers for SI, pt is noted uninsured, referrals will be made for inpt admission for psych.  Demographics, insurance and PCP confirmed.    Referrals to [facility], [facility] and [facility] placed in Epic, CM will await response.       03/04/22 1036   Service Assessment   Patient Orientation Alert and Oriented   Cognition Alert   History Provided By Patient   Primary Caregiver Self   Accompanied By/Relationship none   Support Systems None   PCP Verified by CM Yes  (no current PCP)   Prior Functional Level Independent in ADLs/IADLs   Current Functional Level Independent in ADLs/IADLs   Can patient return to prior living arrangement Yes   Ability to make needs known: Good   Family able to assist with home care needs: Other (comment)  (unknown)   Would you like for me to discuss the discharge plan with any other family members/significant others, and if so, who? No   Financial Resources None   [facility] None   CM/SW Referral Psychiatry   Social/Functional History   Lives With Alone   Type of Home House   Bathroom Accessibility Accessible   ADL Assistance Independent   Ambulation Assistance Independent   Transfer Assistance Independent   Occupation Unemployed   Discharge Planning   Type of Residence Behavioral [facility]   Living Arrangements Alone   Current Services Prior To Admission None   Potential Assistance Needed N/A   DME Ordered? No   Potential Assistance Purchasing Medications No   Type of Home Care Services None   Patient expects to be discharged to: Unknown   History of falls? 0   Services At/After Discharge   Transition of Care Consult (CM Consult) N/A   Services At/After Discharge None

## 2022-03-05 MED ORDER — NICOTINE 21 MG/24HR TD PT24
21 MG/24HR | TRANSDERMAL | Status: DC
Start: 2022-03-05 — End: 2022-03-05
  Administered 2022-03-05: 05:00:00 1 via TRANSDERMAL

## 2022-03-05 MED FILL — HYDROXYZINE HCL 25 MG PO TABS: 25 MG | ORAL | Qty: 2

## 2022-03-05 MED FILL — OLANZAPINE 5 MG PO TBDP: 5 MG | ORAL | Qty: 1

## 2022-03-05 MED FILL — NICOTINE 21 MG/24HR TD PT24: 21 MG/24HR | TRANSDERMAL | Qty: 1

## 2022-03-05 NOTE — ED Notes (Signed)
Patient resting at this time. No signs or symptoms of distress. Respirations even and unlabored. Sitter at bedside. Safety precautions in place.       [name], RN  03/05/22 [phone]

## 2022-03-05 NOTE — ED Notes (Signed)
Patient resting at this time. No signs or symptoms of distress. Respirations even and unlabored. Sitter at bedside. Safety precautions in place.     Altair Stanko, RN  03/05/22 0611

## 2022-03-05 NOTE — ED Notes (Signed)
Patient resting at this time. No signs or symptoms of distress. Respirations even and unlabored. Sitter at bedside. Safety precautions in place.     Thurnell Lose, RN  03/05/22 [phone]

## 2022-03-05 NOTE — ED Notes (Signed)
Sitter on break, this RN to sit till returns     [employer], [place]  03/05/22 [phone]

## 2022-03-05 NOTE — ED Notes (Signed)
Constant Observer No   Constant Observer Oriented N/A   High risk patients are in line of sight at all times No - Low Risk   Excess equipment/medical supplies not necessary for the care of the patient removed Yes   All sharp or dangerous objects are removed from room: including but not limited to belts, pens & pencils, needles, medications, cosmetics, lighters, matches, nail files, watches, necklaces, glass objects, razors, razor blades, knives, aerosol sprays, drawstring pants, shoes, cords (telephone, call bells, etc.) cleaning wipes or other cleaning items, aluminum cans, not permanently attached wall d??cor Yes   Telephone/cell phone removed as well as TV remote (batteries can be swallowed) Yes   Patient belongings removed and labeled at nurses station Yes   Excess linen is removed from room Yes   All plastic bags are removed from the room and replaced with paper trash bags Yes   Patient is in paper scrubs or appropriate gown and using hospital socks with rubber soles Yes   No metal, hard eating utensils or hard plates are on meal tray Yes   Remove all cleaning agents used by [employer] Yes   If Crucifix is hanging on a nail, remove Crucifix as well as the nail Yes       *If any question above is answered "No," documentation is required.      Gevena Barre, RN  03/05/22 2007

## 2022-03-05 NOTE — ED Notes (Signed)
Patient resting at this time. No signs or symptoms of distress. Respirations even and unlabored. Sitter at bedside. Safety precautions in place.       [name], RN  03/05/22 [phone]

## 2022-03-05 NOTE — ED Notes (Signed)
Patient resting at this time. No signs or symptoms of distress. Respirations even and unlabored. Sitter at bedside. Safety precautions in place.       [name], RN  03/05/22 0111

## 2022-03-05 NOTE — ED Notes (Signed)
Patient resting at this time. No signs or symptoms of distress. Respirations even and unlabored. Sitter at bedside. Safety precautions in place.     Thurnell Lose, RN  03/05/22 [phone]

## 2022-03-05 NOTE — ED Notes (Signed)
Patient resting at this time. No signs or symptoms of distress. Respirations even and unlabored. Sitter at bedside. Safety precautions in place.      Carlisle Beers, RN  03/05/22 [phone]

## 2022-03-05 NOTE — ED Notes (Addendum)
Patient resting at this time. No signs or symptoms of distress. Respirations even and unlabored. Sitter at bedside. Safety precautions in place.      Carlisle Beers, RN  03/05/22 0402       Carlisle Beers, RN  03/05/22 0403

## 2022-03-06 MED ORDER — NICOTINE 21 MG/24HR TD PT24
21 MG/24HR | Freq: Every day | TRANSDERMAL | Status: DC
Start: 2022-03-06 — End: 2022-03-07
  Administered 2022-03-06 – 2022-03-07 (×2): 1 via TRANSDERMAL

## 2022-03-06 MED FILL — OLANZAPINE 5 MG PO TBDP: 5 MG | ORAL | Qty: 1

## 2022-03-06 MED FILL — NICOTINE 21 MG/24HR TD PT24: 21 MG/24HR | TRANSDERMAL | Qty: 1

## 2022-03-06 NOTE — ED Notes (Signed)
Subjective:  46 year old male being held on papers for suicidal ideation.  No updates from nursing or social work regarding placement at this time, patient denies any medical complaints, well-appearing.      Objective:  03/11 0814 108/66 97.7 ??F (36.5 ??C) 78 16 100 %     General alert and oriented x4, nontoxic appearance, no acute distress  Respiratory: Symmetric chest rise, no increased work of breathing, tachypnea accessory muscle use  Cardiac: Regular rhythm, no rub or gallop noted on my exam  GI: Soft, nontender nondistended  Musculoskeletal: No obvious joint deformity or joint effusion, normal joint range of motion  Psych: Fairly flat affect, irritable at times, does not appear to have tangential or disorganized speech/thinking    Assessment/plan:  46 year old male being held based on concern for suicidal ideation awaiting placement, no updates at this time, will continue to work for placement, no medical complaints or needs at this time.  Patient will be signed out to oncoming/overnight physician pending reassessment in the a.m.     Ann Maki, MD  03/06/22 2308

## 2022-03-06 NOTE — ED Notes (Addendum)
Pt is lying in bed awake.  No signs of distress. Pt given orange juice, graham crackers, and pudding. Pt has no other requests.     Ella Bodo, RN  03/06/22 3664       Ella Bodo, RN  03/06/22 [phone]

## 2022-03-06 NOTE — ED Notes (Signed)
Constant Observer No   Constant Observer Oriented N/A   High risk patients are in line of sight at all times Yes   Excess equipment/medical supplies not necessary for the care of the patient removed Yes   All sharp or dangerous objects are removed from room: including but not limited to belts, pens & pencils, needles, medications, cosmetics, lighters, matches, nail files, watches, necklaces, glass objects, razors, razor blades, knives, aerosol sprays, drawstring pants, shoes, cords (telephone, call bells, etc.) cleaning wipes or other cleaning items, aluminum cans, not permanently attached wall d??cor Yes   Telephone/cell phone removed as well as TV remote (batteries can be swallowed) Yes   Patient belongings removed and labeled at nurses station Yes   Excess linen is removed from room Yes   All plastic bags are removed from the room and replaced with paper trash bags Yes   Patient is in paper scrubs or appropriate gown and using hospital socks with rubber soles Yes   No metal, hard eating utensils or hard plates are on meal tray Yes   Remove all cleaning agents used by [employer] Yes   If Crucifix is hanging on a nail, remove Crucifix as well as the nail Yes       *If any question above is answered "No," documentation is required.        Hayden Pedro, RN  03/06/22 1051

## 2022-03-06 NOTE — ED Notes (Signed)
Constant Observer Yes - Name: f   Constant Observer Oriented yes   High risk patients are in line of sight at all times Yes   Excess equipment/medical supplies not necessary for the care of the patient removed Yes   All sharp or dangerous objects are removed from room: including but not limited to belts, pens & pencils, needles, medications, cosmetics, lighters, matches, nail files, watches, necklaces, glass objects, razors, razor blades, knives, aerosol sprays, drawstring pants, shoes, cords (telephone, call bells, etc.) cleaning wipes or other cleaning items, aluminum cans, not permanently attached wall d??cor Yes   Telephone/cell phone removed as well as TV remote (batteries can be swallowed) Yes   Patient belongings removed and labeled at nurses station Yes   Excess linen is removed from room Yes   All plastic bags are removed from the room and replaced with paper trash bags Yes   Patient is in paper scrubs or appropriate gown and using hospital socks with rubber soles Yes   No metal, hard eating utensils or hard plates are on meal tray Yes   Remove all cleaning agents used by [employer] Yes   If Crucifix is hanging on a nail, remove Crucifix as well as the nail Yes       *If any question above is answered "No," documentation is required.       Brandin Dilday, RN  03/06/22 1915

## 2022-03-07 MED FILL — OLANZAPINE 5 MG PO TBDP: 5 MG | ORAL | Qty: 1

## 2022-03-07 MED FILL — NICOTINE 21 MG/24HR TD PT24: 21 MG/24HR | TRANSDERMAL | Qty: 1

## 2022-03-07 NOTE — Discharge Instructions (Addendum)
Return as needed for any questions, concerns or worsening symptoms, particularly increasing/unremitting thoughts of suicidal ideation, worsening thoughts of wanting to hurt yourself or anyone else.  Please call/follow-up with [place] area mental health as soon as possible

## 2022-03-07 NOTE — ED Notes (Signed)
Pt resting comfortably in bed in no distress with unlabored respirations     Gloriann Loan, RN  03/07/22 [phone]

## 2022-03-07 NOTE — ED Notes (Signed)
Pt resting comfortably in bed in no distress with unlabored respirations     Tydus Sanmiguel, RN  03/07/22 0303

## 2022-03-07 NOTE — ED Notes (Signed)
Pt resting comfortably in bed in no distress with unlabored respirations     Dayln Tugwell, RN  03/07/22 0604

## 2022-03-07 NOTE — ED Notes (Signed)
Pt resting comfortably in bed in no distress with unlabored respirations     Velora Horstman, RN  03/07/22 0604

## 2022-03-07 NOTE — ED Notes (Signed)
I have reviewed discharge instructions with the patient.  The patient verbalized understanding.    Patient left ED via Discharge Method: ambulatory to Home with girlfriend Opportunity for questions and clarification provided.       Patient given 3 scripts.         To continue your aftercare when you leave the hospital, you may receive an automated call from our care team to check in on how you are doing.  This is a free service and part of our promise to provide the best care and service to meet your aftercare needs.??? If you have questions, or wish to unsubscribe from this service please call [phone].  Thank you for Choosing our [facility] Emergency Department.        Cordelia Poche, RN  03/07/22 [phone]

## 2022-03-07 NOTE — ED Notes (Signed)
Pt resting comfortably in bed in no distress with unlabored respirations     Gloriann Loan, RN  03/07/22 [phone]

## 2022-03-07 NOTE — ED Notes (Signed)
Pt resting comfortably in bed in no distress with unlabored respirations     Kahmya Pinkham, RN  03/07/22 0303

## 2022-03-07 NOTE — ED Notes (Signed)
Pt resting comfortably in bed in no distress with unlabored respirations     Sarahanne Novakowski, RN  03/07/22 0303

## 2022-03-07 NOTE — ED Notes (Signed)
Emergency Department Provider Note                   PCP:                None None               Age: 46 y.o.      Sex: male       Subjective:  46 year old male previously on papers expired this morning for suicidal ideation.  Patient underwent telepsychiatry evaluation, now denying suicidal ideation and felt to be cleared for outpatient management.       Objective:  Physical Exam   Vitals signs reviewed.   Patient Vitals for the past 24 hrs:   BP Temp Pulse Resp SpO2   03/07/22 0726 (!) 103/59 97.7 ??F (36.5 ??C) 68 -- 97 %   03/07/22 0600 110/88 98.1 ??F (36.7 ??C) 68 16 100 %       General: Alert and oriented ??4, no acute distress   Eyes: Anicteric, conjunctiva pink, PERRLA, EOMI  ENT: No nasal discharge, no gross nasal congestion present  Pulmonary: Symmetric chest rise, no increased work of breathing, no accessory muscle use  Cardiovascular: Regular rate and rhythm, no rub or gallop appreciated on my exam  GI: Abdomen is soft, nontender, nondistended  Musculoskeletal: No obvious joint deformity or joint effusion, normal joint range of motion  Neuro: Cranial nerves II through VII grossly intact, strength and sensation is grossly intact in the upper and lower extremities bilaterally  Skin: Skin is warm and dry        Assessment/Plan:  Patient well-appearing, no medical complaints, plan for discharge at this time, given outpatient resources for shelter, [place] area mental health, suicide hotline.  Patient facesheet/information  given to social work for outpatient contact as soon as possible.    Diagnosis/impression:  1.  Suicidal ideation    Disposition: Discharge      Voice dictation software was used during the making of this note.  This software is not perfect and grammatical and other typographical errors may be present.  This note has not been completely proofread for errors.      Ann Maki, MD  03/07/22 [phone]

## 2022-03-07 NOTE — ED Notes (Signed)
Pt resting comfortably in bed in no distress with unlabored respirations     Gloriann Loan, RN  03/07/22 [phone]

## 2022-03-08 ENCOUNTER — Inpatient Hospital Stay
Admit: 2022-03-08 | Discharge: 2022-03-08 | Disposition: A | Discharge status: Home / Self Care | Attending: Emergency Medicine

## 2022-03-08 DIAGNOSIS — F99 Mental disorder, not otherwise specified: Secondary | ICD-10-CM

## 2022-03-08 MED ORDER — OLANZAPINE 5 MG PO TABS
5 MG | ORAL_TABLET | Freq: Two times a day (BID) | ORAL | 0 refills | Status: AC
Start: 2022-03-08 — End: 2022-04-07

## 2022-03-08 MED ORDER — OLANZAPINE 5 MG PO TABS
5 MG | ORAL_TABLET | Freq: Two times a day (BID) | ORAL | 0 refills | Status: DC
Start: 2022-03-08 — End: 2022-03-08

## 2022-03-08 MED ORDER — HYDROXYZINE HCL 50 MG PO TABS
50 MG | ORAL_TABLET | Freq: Three times a day (TID) | ORAL | 0 refills | Status: DC | PRN
Start: 2022-03-08 — End: 2022-03-08

## 2022-03-08 MED ORDER — HYDROXYZINE HCL 50 MG PO TABS
50 MG | ORAL_TABLET | Freq: Three times a day (TID) | ORAL | 0 refills | Status: AC | PRN
Start: 2022-03-08 — End: 2022-03-18

## 2022-03-08 NOTE — ED Notes (Signed)
I have reviewed discharge instructions with the patient and spouse.  The patient and spouse verbalized understanding.    Patient left ED via Discharge Method: ambulatory to Home with family.    Opportunity for questions and clarification provided.       Patient given 2 scripts.         To continue your aftercare when you leave the hospital, you may receive an automated call from our care team to check in on how you are doing.  This is a free service and part of our promise to provide the best care and service to meet your aftercare needs.??? If you have questions, or wish to unsubscribe from this service please call [phone].  Thank you for Choosing our [facility] Emergency Department.        Heriberto Antigua, RN  03/08/22 1050
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
## 2022-03-08 NOTE — ED Triage Notes (Signed)
D/c yesterday from [facility] here, was told to continue his medications.reports its  6 months for mental health apt. Was told that he was sent home with px, but never got any. While here he was getting Zyprexa which helped. Pt denies SI, auditory/visual hallucinations.

## 2022-03-08 NOTE — Discharge Instructions (Signed)
Return if worsening. Call the number below to establish care with PCP.     We would love to help you get a primary care doctor for follow-up after your emergency department visit.    Please call [phone] between 7AM - 6PM Monday to Friday.  A care navigator will be able to assist you with setting up a doctor close to your home.

## 2022-12-12 IMAGING — US US SCROTUM W/ DOPPLER COMPLETE
1 series · 15 of 25 positions shown · non-contrast
Comparison: None.

CLINICAL DATA: Testicular pain for 1 day

EXAM:
SCROTAL ULTRASOUND
DOPPLER ULTRASOUND OF THE TESTICLES
TECHNIQUE: Complete ultrasound examination of the testicles, epididymis, and
other scrotal structures was performed. Color and spectral Doppler
ultrasound were also utilized to evaluate blood flow to the
testicles.

[Series 1: us scrotum mc & wl · 15 of 47 slices shown]
[im 1/47]
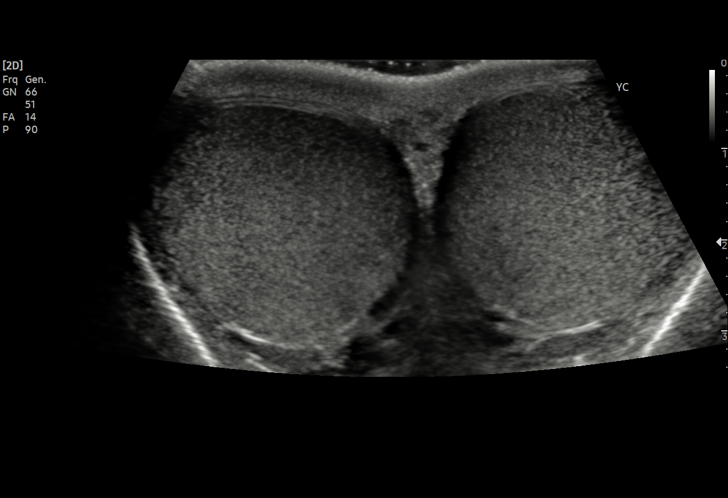
[im 4/47]
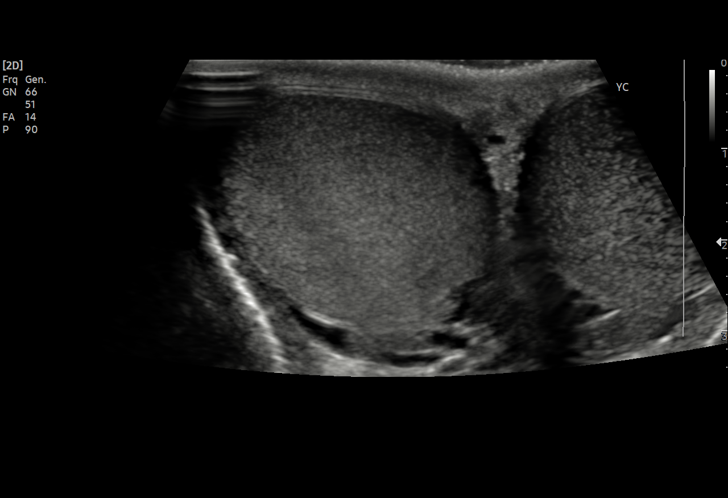
[im 8/47]
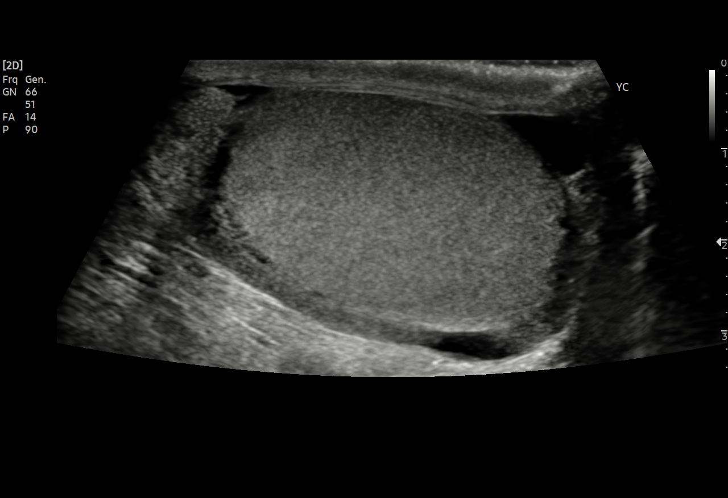
[im 10/47]
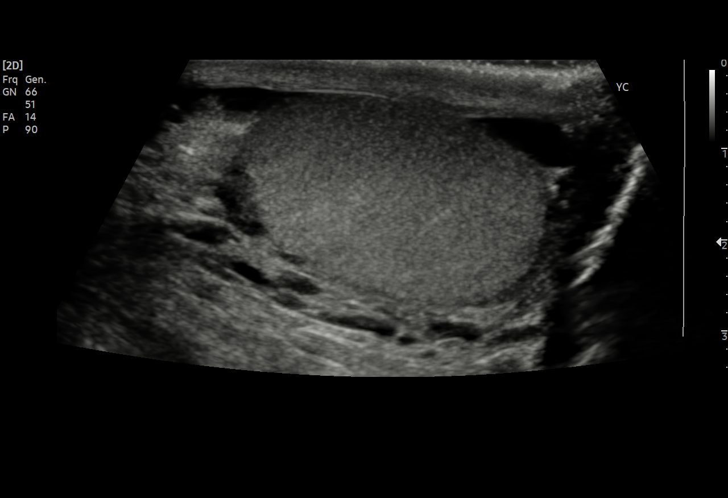
[im 14/47]
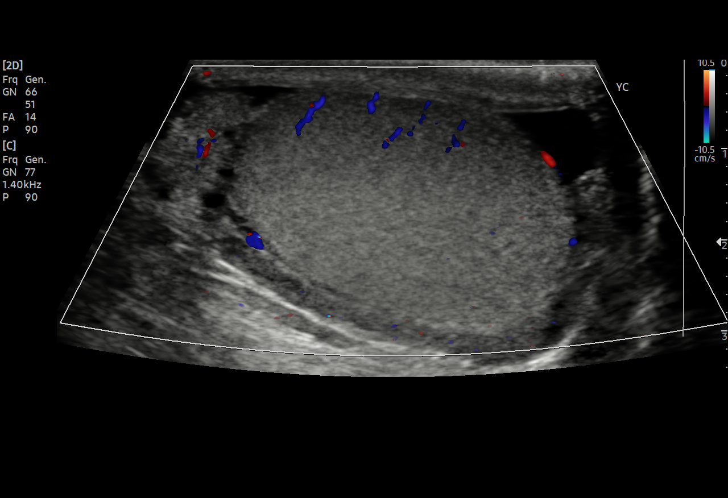
[im 18/47]
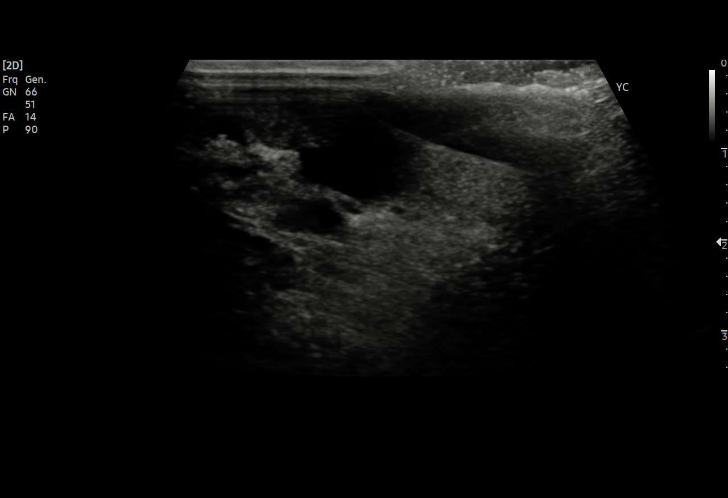
[im 20/47]
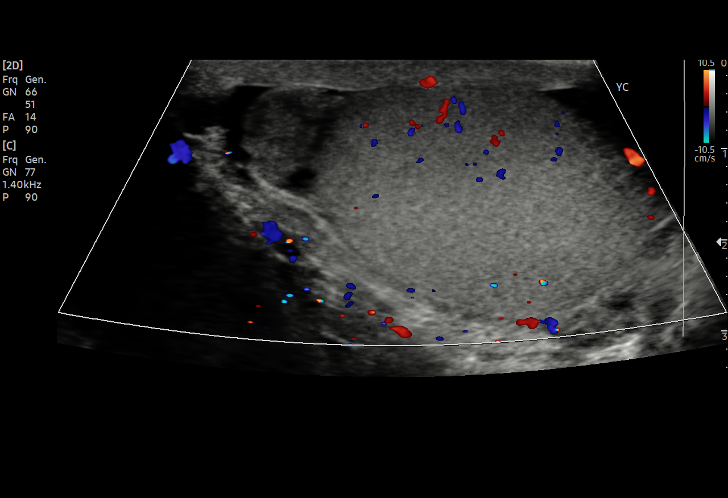
[im 24/47]
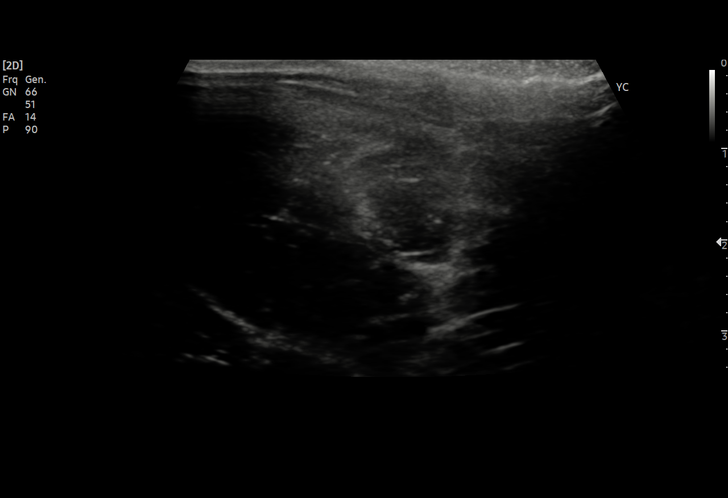
[im 27/47]
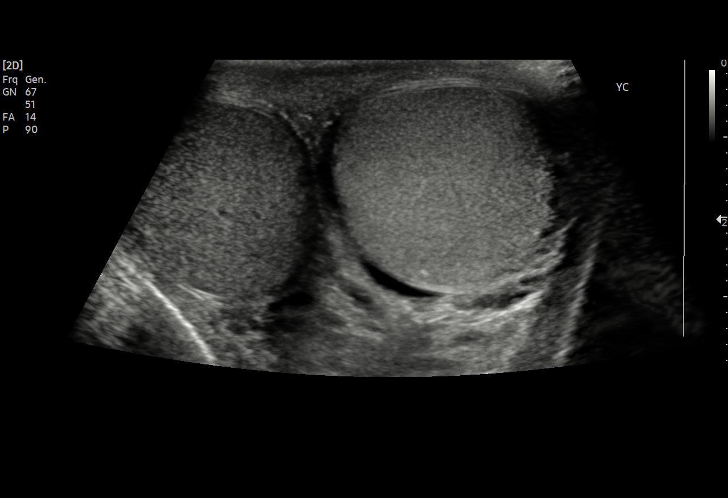
[im 29/47]
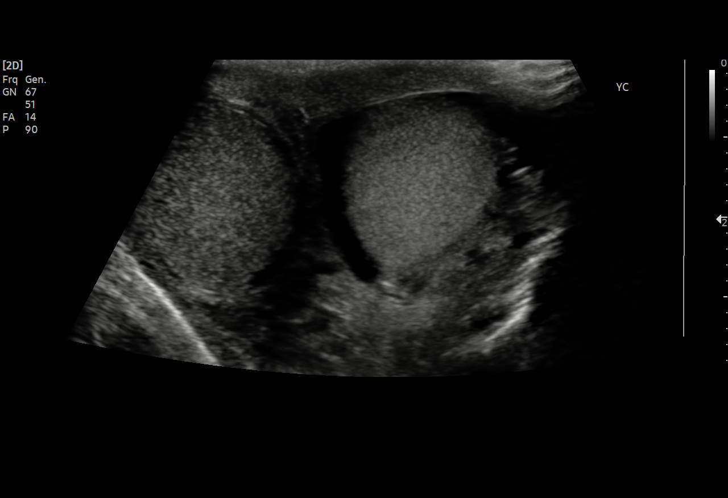
[im 33/47]
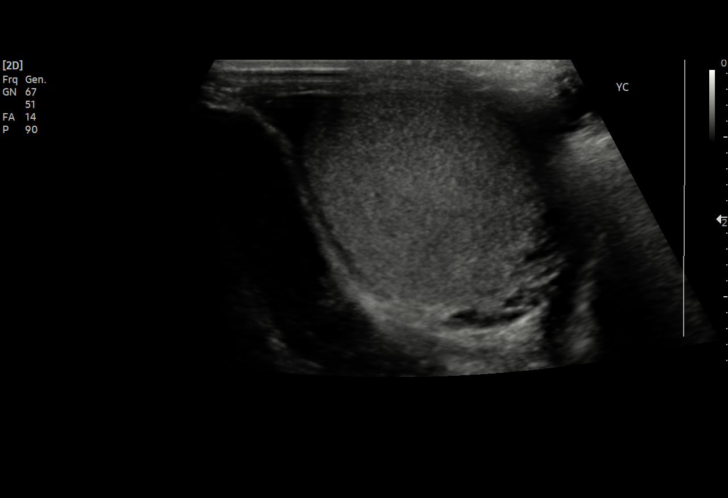
[im 37/47]
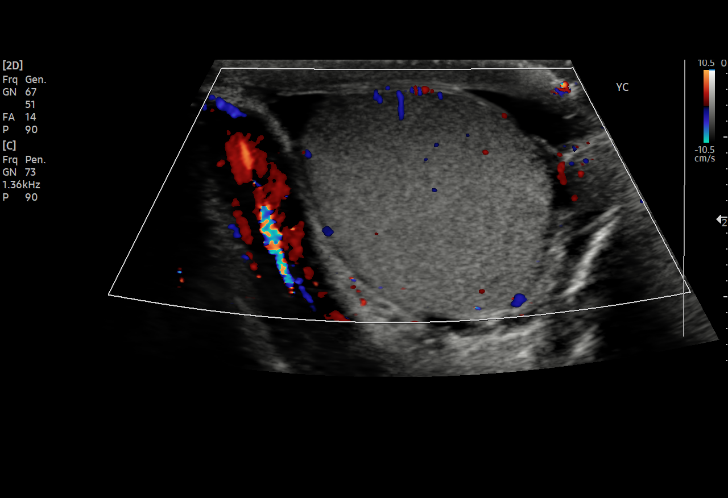
[im 39/47]
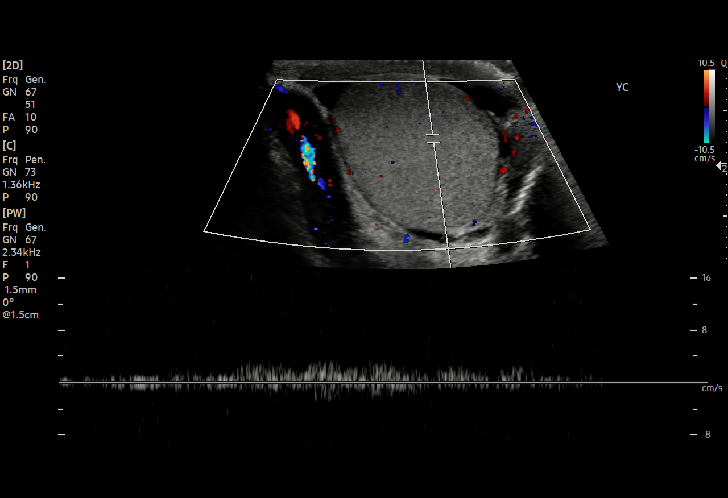
[im 43/47]
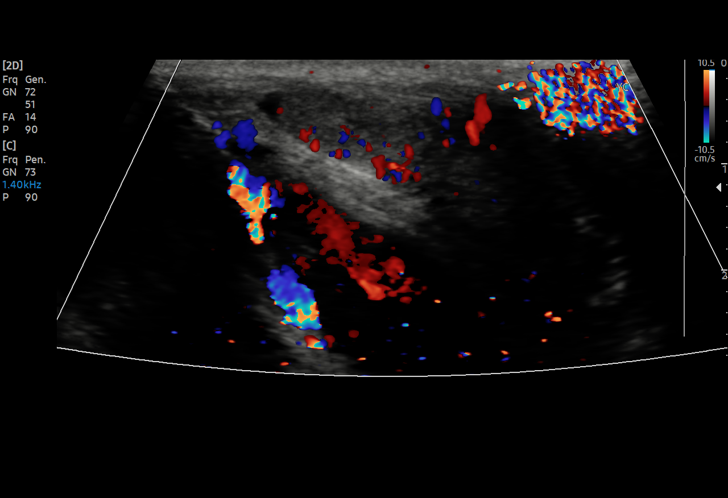
[im 47/47]
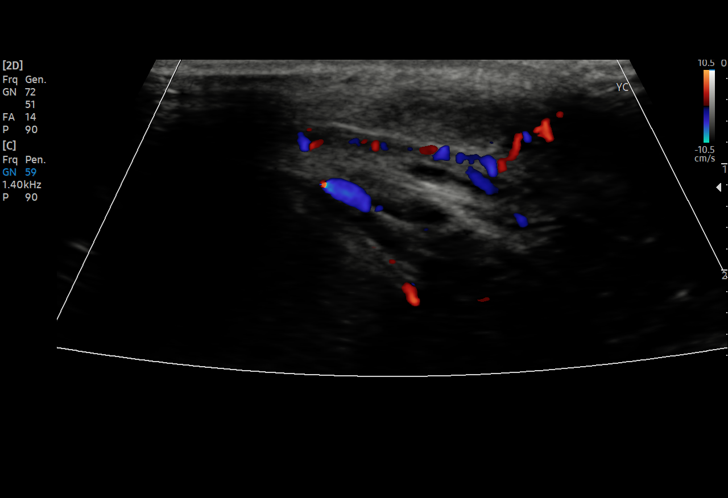

[15 of 25 positions shown; findings below may reference images not displayed]

FINDINGS: Right testicle

Measurements: 38 x 24 x 29 mm. No mass or microlithiasis visualized.

Left testicle

Measurements: 35 x 26 x 28 mm. No mass or microlithiasis visualized.

Right epididymis:  Normal in size and appearance.

Left epididymis:  Normal in size and appearance.

Hydrocele:  Trace, simple fluid bilaterally, commonly encountered.

Varicocele:  None visualized.

Pulsed Doppler interrogation of both testes demonstrates normal low
resistance arterial and venous waveforms bilaterally.
IMPRESSION: Trace bilateral hydrocele with normal appearance of the bilateral
testicle and epididymis. No specific cause for symptoms.
